# Patient Record
Sex: Female | Born: 1985
Health system: Southern US, Community
[De-identification: ages and names within clinical notes are randomized; demographics above are authoritative.]

## PROBLEM LIST (undated history)

## (undated) DIAGNOSIS — Z789 Other specified health status: Secondary | ICD-10-CM

## (undated) DIAGNOSIS — K219 Gastro-esophageal reflux disease without esophagitis: Secondary | ICD-10-CM

## (undated) HISTORY — PX: TUBAL LIGATION: SHX77

## (undated) HISTORY — PX: NO PAST SURGERIES: SHX2092

---

## 2009-02-12 ENCOUNTER — Ambulatory Visit: Payer: Self-pay | Admitting: Family Medicine

## 2009-02-12 DIAGNOSIS — M545 Low back pain: Secondary | ICD-10-CM

## 2009-03-04 ENCOUNTER — Ambulatory Visit: Payer: Self-pay | Admitting: Family Medicine

## 2009-03-04 DIAGNOSIS — K089 Disorder of teeth and supporting structures, unspecified: Secondary | ICD-10-CM | POA: Insufficient documentation

## 2009-07-24 ENCOUNTER — Ambulatory Visit: Payer: Self-pay | Admitting: Occupational Medicine

## 2009-07-24 DIAGNOSIS — S6390XA Sprain of unspecified part of unspecified wrist and hand, initial encounter: Secondary | ICD-10-CM | POA: Insufficient documentation

## 2010-10-07 ENCOUNTER — Ambulatory Visit: Payer: Self-pay | Admitting: Obstetrics and Gynecology

## 2010-10-07 ENCOUNTER — Other Ambulatory Visit
Admission: RE | Admit: 2010-10-07 | Discharge: 2010-10-07 | Payer: Self-pay | Source: Home / Self Care | Admitting: Obstetrics and Gynecology

## 2010-10-07 ENCOUNTER — Encounter: Payer: Self-pay | Admitting: Obstetrics and Gynecology

## 2010-10-07 LAB — CONVERTED CEMR LAB
Antibody Screen: NEGATIVE
Basophils Absolute: 0 10*3/uL (ref 0.0–0.1)
Basophils Relative: 0 % (ref 0–1)
Eosinophils Absolute: 0.1 10*3/uL (ref 0.0–0.7)
Eosinophils Relative: 1 % (ref 0–5)
HCT: 38 % (ref 36.0–46.0)
HIV: NONREACTIVE
Hemoglobin: 13.2 g/dL (ref 12.0–15.0)
Hepatitis B Surface Ag: NEGATIVE
Lymphocytes Relative: 19 % (ref 12–46)
Lymphs Abs: 2 10*3/uL (ref 0.7–4.0)
MCHC: 34.7 g/dL (ref 30.0–36.0)
MCV: 86.8 fL (ref 78.0–100.0)
Monocytes Absolute: 0.5 10*3/uL (ref 0.1–1.0)
Monocytes Relative: 5 % (ref 3–12)
Neutro Abs: 8.2 10*3/uL — ABNORMAL HIGH (ref 1.7–7.7)
Neutrophils Relative %: 76 % (ref 43–77)
Platelets: 272 10*3/uL (ref 150–400)
RBC: 4.38 M/uL (ref 3.87–5.11)
RDW: 12.6 % (ref 11.5–15.5)
Rh Type: POSITIVE
Rubella: 144.8 intl units/mL — ABNORMAL HIGH
WBC: 10.7 10*3/uL — ABNORMAL HIGH (ref 4.0–10.5)

## 2010-10-07 LAB — ABO/RH: RH Type: POSITIVE

## 2010-10-30 NOTE — L&D Delivery Note (Signed)
Delivery Note At  a viable female was delivered via  (Presentation: LOA ) at 28.  Mouth and nose bulb suctioned.   Cord clamped and cut infant placed on mother's abdomen.  Cord blood sampled. APGAR: 9,9 ; weight .  7lbs 9.4oz Placenta status: spont and intact with 3VC @1945   Intact perinuem. Good uterine firming with fundal massage and pitocin.   Anesthesia:  Epidural Episiotomy: none Lacerations: none Suture Repair: none Est. Blood Loss (mL): 400  Mom to postpartum.  Baby to nursery-stable.  Tayquan Gassman 05/11/2011, 7:55 PM

## 2010-11-04 ENCOUNTER — Ambulatory Visit
Admission: RE | Admit: 2010-11-04 | Discharge: 2010-11-04 | Payer: Self-pay | Source: Home / Self Care | Attending: Obstetrics and Gynecology | Admitting: Obstetrics and Gynecology

## 2010-11-17 ENCOUNTER — Other Ambulatory Visit (HOSPITAL_COMMUNITY): Payer: Self-pay | Admitting: *Deleted

## 2010-11-17 DIAGNOSIS — Z3689 Encounter for other specified antenatal screening: Secondary | ICD-10-CM

## 2010-12-03 ENCOUNTER — Other Ambulatory Visit: Payer: Self-pay | Admitting: Family Medicine

## 2010-12-03 ENCOUNTER — Other Ambulatory Visit (HOSPITAL_COMMUNITY): Payer: Self-pay | Admitting: *Deleted

## 2010-12-03 DIAGNOSIS — Z3689 Encounter for other specified antenatal screening: Secondary | ICD-10-CM

## 2010-12-05 ENCOUNTER — Encounter (HOSPITAL_COMMUNITY): Payer: Self-pay

## 2010-12-05 ENCOUNTER — Ambulatory Visit (HOSPITAL_COMMUNITY)
Admission: RE | Admit: 2010-12-05 | Discharge: 2010-12-05 | Disposition: A | Discharge status: Home / Self Care | Payer: Medicaid Other | Source: Ambulatory Visit | Attending: Family Medicine | Admitting: Family Medicine

## 2010-12-05 ENCOUNTER — Ambulatory Visit (HOSPITAL_COMMUNITY): Admission: RE | Admit: 2010-12-05 | Payer: Self-pay | Source: Home / Self Care | Admitting: Family Medicine

## 2010-12-05 DIAGNOSIS — Z3689 Encounter for other specified antenatal screening: Secondary | ICD-10-CM

## 2010-12-05 DIAGNOSIS — Z1389 Encounter for screening for other disorder: Secondary | ICD-10-CM | POA: Insufficient documentation

## 2010-12-05 DIAGNOSIS — O358XX0 Maternal care for other (suspected) fetal abnormality and damage, not applicable or unspecified: Secondary | ICD-10-CM | POA: Insufficient documentation

## 2010-12-05 DIAGNOSIS — Z363 Encounter for antenatal screening for malformations: Secondary | ICD-10-CM | POA: Insufficient documentation

## 2010-12-09 DIAGNOSIS — Z3480 Encounter for supervision of other normal pregnancy, unspecified trimester: Secondary | ICD-10-CM

## 2010-12-10 ENCOUNTER — Encounter: Payer: Self-pay | Admitting: Obstetrics & Gynecology

## 2010-12-28 ENCOUNTER — Encounter: Payer: Medicaid Other | Admitting: Obstetrics & Gynecology

## 2010-12-28 DIAGNOSIS — Z3480 Encounter for supervision of other normal pregnancy, unspecified trimester: Secondary | ICD-10-CM

## 2010-12-29 ENCOUNTER — Encounter: Payer: Self-pay | Admitting: Obstetrics & Gynecology

## 2011-01-17 ENCOUNTER — Encounter: Payer: Medicaid Other | Admitting: Obstetrics & Gynecology

## 2011-01-17 DIAGNOSIS — Z3400 Encounter for supervision of normal first pregnancy, unspecified trimester: Secondary | ICD-10-CM

## 2011-02-10 DIAGNOSIS — Z3480 Encounter for supervision of other normal pregnancy, unspecified trimester: Secondary | ICD-10-CM

## 2011-02-20 ENCOUNTER — Other Ambulatory Visit: Payer: Self-pay | Admitting: Family Medicine

## 2011-02-20 DIAGNOSIS — Z3689 Encounter for other specified antenatal screening: Secondary | ICD-10-CM

## 2011-02-24 ENCOUNTER — Encounter (INDEPENDENT_AMBULATORY_CARE_PROVIDER_SITE_OTHER): Payer: Medicaid Other

## 2011-02-24 DIAGNOSIS — Z3480 Encounter for supervision of other normal pregnancy, unspecified trimester: Secondary | ICD-10-CM

## 2011-03-10 ENCOUNTER — Encounter (INDEPENDENT_AMBULATORY_CARE_PROVIDER_SITE_OTHER): Payer: Medicaid Other

## 2011-03-10 DIAGNOSIS — Z3480 Encounter for supervision of other normal pregnancy, unspecified trimester: Secondary | ICD-10-CM

## 2011-03-24 ENCOUNTER — Encounter (INDEPENDENT_AMBULATORY_CARE_PROVIDER_SITE_OTHER): Payer: Medicaid Other

## 2011-03-24 DIAGNOSIS — Z3480 Encounter for supervision of other normal pregnancy, unspecified trimester: Secondary | ICD-10-CM

## 2011-04-07 ENCOUNTER — Encounter (INDEPENDENT_AMBULATORY_CARE_PROVIDER_SITE_OTHER): Payer: Medicaid Other

## 2011-04-07 DIAGNOSIS — Z3480 Encounter for supervision of other normal pregnancy, unspecified trimester: Secondary | ICD-10-CM

## 2011-04-10 ENCOUNTER — Encounter (INDEPENDENT_AMBULATORY_CARE_PROVIDER_SITE_OTHER): Payer: Medicaid Other

## 2011-04-10 DIAGNOSIS — Z3480 Encounter for supervision of other normal pregnancy, unspecified trimester: Secondary | ICD-10-CM

## 2011-04-10 DIAGNOSIS — O479 False labor, unspecified: Secondary | ICD-10-CM

## 2011-04-10 LAB — STREP B DNA PROBE: GBS: NEGATIVE

## 2011-04-17 ENCOUNTER — Encounter (INDEPENDENT_AMBULATORY_CARE_PROVIDER_SITE_OTHER): Payer: Medicaid Other

## 2011-04-17 DIAGNOSIS — Z3480 Encounter for supervision of other normal pregnancy, unspecified trimester: Secondary | ICD-10-CM

## 2011-04-24 ENCOUNTER — Encounter (INDEPENDENT_AMBULATORY_CARE_PROVIDER_SITE_OTHER): Payer: Medicaid Other

## 2011-04-24 DIAGNOSIS — Z3480 Encounter for supervision of other normal pregnancy, unspecified trimester: Secondary | ICD-10-CM

## 2011-05-02 ENCOUNTER — Encounter (INDEPENDENT_AMBULATORY_CARE_PROVIDER_SITE_OTHER): Payer: Medicaid Other | Admitting: Obstetrics & Gynecology

## 2011-05-02 DIAGNOSIS — Z3480 Encounter for supervision of other normal pregnancy, unspecified trimester: Secondary | ICD-10-CM

## 2011-05-08 ENCOUNTER — Ambulatory Visit (INDEPENDENT_AMBULATORY_CARE_PROVIDER_SITE_OTHER): Payer: Medicaid Other | Admitting: Physician Assistant

## 2011-05-08 VITALS — BP 110/60 | Wt 163.0 lb

## 2011-05-08 DIAGNOSIS — Z3490 Encounter for supervision of normal pregnancy, unspecified, unspecified trimester: Secondary | ICD-10-CM

## 2011-05-08 DIAGNOSIS — Z3480 Encounter for supervision of other normal pregnancy, unspecified trimester: Secondary | ICD-10-CM

## 2011-05-08 NOTE — Progress Notes (Signed)
Pulse  105 bilat lower quad pain

## 2011-05-10 ENCOUNTER — Inpatient Hospital Stay (HOSPITAL_COMMUNITY)
Admission: AD | Admit: 2011-05-10 | Discharge: 2011-05-13 | DRG: 775 | Disposition: A | Discharge status: Home / Self Care | Payer: Medicaid Other | Source: Ambulatory Visit | Attending: Obstetrics & Gynecology | Admitting: Obstetrics & Gynecology

## 2011-05-10 ENCOUNTER — Encounter (HOSPITAL_COMMUNITY): Payer: Self-pay | Admitting: *Deleted

## 2011-05-10 DIAGNOSIS — IMO0002 Reserved for concepts with insufficient information to code with codable children: Secondary | ICD-10-CM | POA: Diagnosis present

## 2011-05-10 DIAGNOSIS — Z3490 Encounter for supervision of normal pregnancy, unspecified, unspecified trimester: Secondary | ICD-10-CM

## 2011-05-10 HISTORY — DX: Other specified health status: Z78.9

## 2011-05-10 LAB — CBC
HCT: 36 % (ref 36.0–46.0)
Hemoglobin: 12.5 g/dL (ref 12.0–15.0)
MCH: 30.8 pg (ref 26.0–34.0)
MCV: 88.7 fL (ref 78.0–100.0)
RBC: 4.06 MIL/uL (ref 3.87–5.11)

## 2011-05-10 LAB — POCT FERN TEST: Fern Test: POSITIVE

## 2011-05-10 MED ORDER — LIDOCAINE HCL (PF) 1 % IJ SOLN
30.0000 mL | Freq: Once | INTRAMUSCULAR | Status: AC | PRN
  Filled 2011-05-10: qty 30

## 2011-05-10 MED ORDER — OXYTOCIN 20 UNITS IN LACTATED RINGERS INFUSION - SIMPLE
125.0000 mL/h | Freq: Once | INTRAVENOUS | Status: DC
  Filled 2011-05-10: qty 1000

## 2011-05-10 MED ORDER — PHENYLEPHRINE 40 MCG/ML (10ML) SYRINGE FOR IV PUSH (FOR BLOOD PRESSURE SUPPORT)
80.0000 ug | PREFILLED_SYRINGE | INTRAVENOUS | Status: DC | PRN
  Filled 2011-05-10 (×2): qty 5

## 2011-05-10 MED ORDER — ACETAMINOPHEN 325 MG PO TABS: 650.0000 mg | ORAL_TABLET | ORAL | Status: DC | PRN

## 2011-05-10 MED ORDER — FLEET ENEMA 7-19 GM/118ML RE ENEM: 1.0000 | ENEMA | RECTAL | Status: DC | PRN

## 2011-05-10 MED ORDER — ONDANSETRON HCL 4 MG/2ML IJ SOLN: 4.0000 mg | Freq: Four times a day (QID) | INTRAMUSCULAR | Status: DC | PRN

## 2011-05-10 MED ORDER — PHENYLEPHRINE 40 MCG/ML (10ML) SYRINGE FOR IV PUSH (FOR BLOOD PRESSURE SUPPORT)
80.0000 ug | PREFILLED_SYRINGE | INTRAVENOUS | Status: DC | PRN
  Filled 2011-05-10: qty 5

## 2011-05-10 MED ORDER — FENTANYL 2.5 MCG/ML BUPIVACAINE 1/10 % EPIDURAL INFUSION (WH - ANES)
2.0000 mL/h | INTRAMUSCULAR | Status: DC
  Administered 2011-05-11: 14 mL/h via EPIDURAL
  Filled 2011-05-10: qty 60

## 2011-05-10 MED ORDER — EPHEDRINE 5 MG/ML INJ
10.0000 mg | INTRAVENOUS | Status: DC | PRN
  Filled 2011-05-10 (×2): qty 4

## 2011-05-10 MED ORDER — LACTATED RINGERS IV SOLN
INTRAVENOUS | Status: DC
  Administered 2011-05-10 – 2011-05-11 (×4): via INTRAVENOUS

## 2011-05-10 MED ORDER — IBUPROFEN 600 MG PO TABS
600.0000 mg | ORAL_TABLET | Freq: Four times a day (QID) | ORAL | Status: DC | PRN
  Filled 2011-05-10: qty 1

## 2011-05-10 MED ORDER — DIPHENHYDRAMINE HCL 50 MG/ML IJ SOLN: 12.5000 mg | INTRAMUSCULAR | Status: DC | PRN

## 2011-05-10 MED ORDER — LACTATED RINGERS IV SOLN: 500.0000 mL | INTRAVENOUS | Status: DC | PRN

## 2011-05-10 MED ORDER — EPHEDRINE 5 MG/ML INJ
10.0000 mg | INTRAVENOUS | Status: DC | PRN
  Filled 2011-05-10: qty 4

## 2011-05-10 MED ORDER — CITRIC ACID-SODIUM CITRATE 334-500 MG/5ML PO SOLN: 30.0000 mL | ORAL | Status: DC | PRN

## 2011-05-10 MED ORDER — LACTATED RINGERS IV SOLN
500.0000 mL | Freq: Once | INTRAVENOUS | Status: AC
  Administered 2011-05-11: 500 mL via INTRAVENOUS

## 2011-05-10 NOTE — Plan of Care (Signed)
Problem: Consults Goal: Birthing Suites Patient Information Press F2 to bring up selections list Outcome: Completed/Met Date Met:  05/10/11  Pt > [redacted] weeks EGA

## 2011-05-10 NOTE — Progress Notes (Signed)
Pt states, " My water broke at 5:20 pm; a lot of water came out and it keeps come and my pad is soaked. Then contractions started right after that."

## 2011-05-11 ENCOUNTER — Inpatient Hospital Stay (HOSPITAL_COMMUNITY): Admission: RE | Admit: 2011-05-11 | Payer: Medicaid Other | Source: Ambulatory Visit

## 2011-05-11 ENCOUNTER — Encounter (HOSPITAL_COMMUNITY): Payer: Self-pay | Admitting: *Deleted

## 2011-05-11 ENCOUNTER — Encounter (HOSPITAL_COMMUNITY): Payer: Self-pay | Admitting: Anesthesiology

## 2011-05-11 ENCOUNTER — Inpatient Hospital Stay (HOSPITAL_COMMUNITY): Payer: Medicaid Other | Admitting: Anesthesiology

## 2011-05-11 DIAGNOSIS — IMO0002 Reserved for concepts with insufficient information to code with codable children: Secondary | ICD-10-CM | POA: Diagnosis present

## 2011-05-11 LAB — RPR: RPR Ser Ql: NONREACTIVE

## 2011-05-11 MED ORDER — PRENATAL PLUS 27-1 MG PO TABS
1.0000 | ORAL_TABLET | Freq: Every day | ORAL | Status: DC
  Administered 2011-05-12 – 2011-05-13 (×2): 1 via ORAL
  Filled 2011-05-11 (×2): qty 1

## 2011-05-11 MED ORDER — BENZOCAINE-MENTHOL 20-0.5 % EX AERO
1.0000 "application " | INHALATION_SPRAY | CUTANEOUS | Status: DC | PRN
  Administered 2011-05-12: 1 via TOPICAL

## 2011-05-11 MED ORDER — ZOLPIDEM TARTRATE 5 MG PO TABS: 5.0000 mg | ORAL_TABLET | Freq: Every evening | ORAL | Status: DC | PRN

## 2011-05-11 MED ORDER — DIPHENHYDRAMINE HCL 25 MG PO CAPS: 25.0000 mg | ORAL_CAPSULE | Freq: Four times a day (QID) | ORAL | Status: DC | PRN

## 2011-05-11 MED ORDER — TERBUTALINE SULFATE 1 MG/ML IJ SOLN: 0.2500 mg | Freq: Once | INTRAMUSCULAR | Status: DC | PRN

## 2011-05-11 MED ORDER — IBUPROFEN 600 MG PO TABS
600.0000 mg | ORAL_TABLET | Freq: Four times a day (QID) | ORAL | Status: DC
  Administered 2011-05-11 – 2011-05-13 (×7): 600 mg via ORAL
  Filled 2011-05-11 (×6): qty 1

## 2011-05-11 MED ORDER — SIMETHICONE 80 MG PO CHEW: 80.0000 mg | CHEWABLE_TABLET | ORAL | Status: DC | PRN

## 2011-05-11 MED ORDER — LIDOCAINE HCL 1.5 % IJ SOLN
INTRAMUSCULAR | Status: DC | PRN
  Administered 2011-05-11: 5 mL
  Administered 2011-05-11: 2 mL
  Administered 2011-05-11: 5 mL

## 2011-05-11 MED ORDER — SENNOSIDES-DOCUSATE SODIUM 8.6-50 MG PO TABS
1.0000 | ORAL_TABLET | Freq: Every day | ORAL | Status: DC
  Administered 2011-05-11: 2 via ORAL
  Administered 2011-05-12: 1 via ORAL

## 2011-05-11 MED ORDER — TERBUTALINE SULFATE 1 MG/ML IJ SOLN: 0.2500 mg | Freq: Once | INTRAMUSCULAR | Status: AC | PRN

## 2011-05-11 MED ORDER — TETANUS-DIPHTH-ACELL PERTUSSIS 5-2.5-18.5 LF-MCG/0.5 IM SUSP
0.5000 mL | Freq: Once | INTRAMUSCULAR | Status: DC
  Filled 2011-05-11: qty 0.5

## 2011-05-11 MED ORDER — LACTATED RINGERS IV SOLN
INTRAVENOUS | Status: DC
  Administered 2011-05-11: 500 mL via INTRAUTERINE

## 2011-05-11 MED ORDER — OXYTOCIN 20 UNITS IN LACTATED RINGERS INFUSION - SIMPLE
1.0000 m[IU]/min | INTRAVENOUS | Status: DC
  Administered 2011-05-11: 2 m[IU]/min via INTRAVENOUS

## 2011-05-11 MED ORDER — WITCH HAZEL-GLYCERIN EX PADS: MEDICATED_PAD | CUTANEOUS | Status: DC | PRN

## 2011-05-11 MED ORDER — LANOLIN HYDROUS EX OINT: TOPICAL_OINTMENT | CUTANEOUS | Status: DC | PRN

## 2011-05-11 MED ORDER — OXYCODONE-ACETAMINOPHEN 5-325 MG PO TABS: 1.0000 | ORAL_TABLET | ORAL | Status: DC | PRN

## 2011-05-11 NOTE — Progress Notes (Signed)
Spoke with K. Clelia Croft, CNM regarding POC for patient. Stated to let patient eat breakfast, shower and to call oncoming CNM for orders for Pitocin.

## 2011-05-11 NOTE — Progress Notes (Signed)
  Karen Khan is a 25 y.o. G3P2002 at [redacted]w[redacted]d admitted for active labor  Subjective:   Objective: BP 103/64  Pulse 93  Temp(Src) 98.5 F (36.9 C) (Axillary)  Resp 20  Ht 5' 3.5" (1.613 m)  Wt 161 lb 2 oz (73.086 kg)  BMI 28.09 kg/m2  LMP 07/28/2010   I/O this shift: In: 24 [I.V.:24] Out: -   FHT:  FHR: 145 bpm, variability: moderate,  accelerations:  Present,  decelerations:  Absent UC:   regular, every 1 minutes; tachysystole  SVE:   Dilation: 3.5 Effacement (%): 70 Station: -2 Exam by:: Dr. Orvan Khan Pitocin: 52mU/min --> 88mU/min  Labs: Lab Results  Component Value Date   WBC 22.7* 05/10/2011   HGB 12.5 05/10/2011   HCT 36.0 05/10/2011   MCV 88.7 05/10/2011   PLT 263 05/10/2011    Assessment / Plan: Augmentation of labor, progressing well, tachysystole  Labor: now with tachysystole with pit at 8, will decrease to 6 in hopes of having less frequent, stronger contractions Fetal Wellbeing:  Category I Pain Control:  Epidural I/D:  n/a Anticipated MOD:  NSVD  Karen Khan 05/11/2011, 4:36 PM

## 2011-05-11 NOTE — Progress Notes (Signed)
Pt id bands verified

## 2011-05-11 NOTE — Anesthesia Procedure Notes (Addendum)
Epidural Patient location during procedure: OB Start time: 05/11/2011 3:47 PM Reason for block: procedure for pain  Staffing Anesthesiologist: CASSIDY, AMY L. Performed by: other anesthesia staff   Preanesthetic Checklist Completed: patient identified, site marked, surgical consent, pre-op evaluation, timeout performed, IV checked, risks and benefits discussed and monitors and equipment checked  Epidural Patient position: sitting Prep: site prepped and draped and DuraPrep Patient monitoring: continuous pulse ox, blood pressure and heart rate Approach: midline Injection technique: LOR air  Needle:  Needle type: Tuohy  Needle gauge: 17 G Needle length: 9 cm Needle insertion depth: 5 cm cm Catheter type: closed end flexible Catheter size: 19 Gauge Catheter at skin depth: 10 cm Test dose: 1.5% lidocaine  Assessment Events: blood not aspirated, injection not painful, no injection resistance, negative IV test and no paresthesia  Additional Notes Procedure by Benay Pillow, SRNA.  Discussed risk of headache, infection, bleeding, nerve injury and failed or incomplete block.  Patient voices understanding and wishes to proceed.

## 2011-05-11 NOTE — H&P (Signed)
Karen Khan is a 25 y.o. female presenting for leaking fluid since 1700. Maternal Medical History:  Reason for admission: Reason for admission: rupture of membranes.  Contractions: Frequency: irregular.   Perceived severity is mild.    Fetal activity: Perceived fetal activity is normal.   Last perceived fetal movement was within the past hour.      OB History    Grav Para Term Preterm Abortions TAB SAB Ect Mult Living   3 2 2  0 0 0 0 0 0 2     Past Medical History  Diagnosis Date  . No pertinent past medical history    Past Surgical History  Procedure Date  . No past surgeries    Family History: family history is not on file. Social History:  reports that she has been smoking.  She does not have any smokeless tobacco history on file. She reports that she does not drink alcohol or use illicit drugs.  Review of Systems  All other systems reviewed and are negative.    Dilation: 2.5 Effacement (%): 50 Station: -2 Exam by:: r. gagnon,rnc Blood pressure 113/76, pulse 103, temperature 98.7 F (37.1 C), temperature source Oral, resp. rate 20, height 5' 3.5" (1.613 m), weight 73.086 kg (161 lb 2 oz), last menstrual period 07/28/2010. Maternal Exam:  Uterine Assessment: Contraction strength is mild.  Contraction frequency is irregular.   Abdomen: Patient reports no abdominal tenderness.   Physical Exam  Prenatal labs: ABO, Rh:   Antibody: NEG (12/09 1918) Rubella:   RPR: NON REAC (12/09 1918)  HBsAg: NEGATIVE (12/09 1918)  HIV: NON REACTIVE (12/09 1918)  GBS:     Assessment/Plan: Admit to L&D. Expectant management. Will augment ctx prn.   Karen Khan B 05/11/2011, 12:23 AM

## 2011-05-11 NOTE — Progress Notes (Signed)
Karen Khan is a 25 y.o. G3P2002 at [redacted]w[redacted]d admitted for rupture of membranes  Subjective: Pt states she is doing well without any acute complaints.  Objective: BP 107/57  Pulse 89  Temp(Src) 98.6 F (37 C) (Oral)  Resp 20  Ht 5' 3.5" (1.613 m)  Wt 73.086 kg (161 lb 2 oz)  BMI 28.09 kg/m2  LMP 07/28/2010      FHT:  FHR: 150 bpm, variability: moderate,  accelerations:  Abscent,  decelerations:  Present early with occ var.+scalp stimulation UC:   regular, every 2 minutes-MVUs >200 x2hrs SVE:   3-4/70/-2 Pitocin: at 8/min  Labs: Lab Results  Component Value Date   WBC 22.7* 05/10/2011   HGB 12.5 05/10/2011   HCT 36.0 05/10/2011   MCV 88.7 05/10/2011   PLT 263 05/10/2011    Assessment / Plan: Augmentation of labor, progressing well Starting amnioinfusion   Labor: progressing with pitocin, continue current rate.  Fetal Wellbeing:  Category II Pain Control:  Labor support without medications I/D:  n/a Anticipated MOD:  NSVD  Sheily Lineman 05/11/2011, 1:23 PM

## 2011-05-11 NOTE — Progress Notes (Signed)
  Karen Khan is a 25 y.o. G3P2002 at [redacted]w[redacted]d admitted for rupture of membranes @ 1700 on 05/10/11  Subjective:   Objective: BP 110/60  Pulse 91  Temp(Src) 98.6 F (37 C) (Oral)  Resp 20  Ht 5' 3.5" (1.613 m)  Wt 161 lb 2 oz (73.086 kg)  BMI 28.09 kg/m2  LMP 07/28/2010      FHT:  FHR: 150-155 bpm, variability: moderate,  accelerations:  Abscent,  decelerations:  Present : multiple earlies, occ variables UC:   regular, every 1-3 minutes SVE:   Dilation: 3 Effacement (%): 60 Station: -2 Exam by:: Farrell Pantaleo, MD  Labs: Lab Results  Component Value Date   WBC 22.7* 05/10/2011   HGB 12.5 05/10/2011   HCT 36.0 05/10/2011   MCV 88.7 05/10/2011   PLT 263 05/10/2011    Assessment / Plan: Augmentation of labor, progressing well  Labor: Cervix remains unchanged; IUPC inserted, will increase pitocin to obtain adequate contractions then recheck Preeclampsia:  N/A Fetal Wellbeing:  Category I Pain Control:  Labor support without medications I/D:  n/a Anticipated MOD:  NSVD  Karen Khan 05/11/2011, 12:12 PM

## 2011-05-11 NOTE — Progress Notes (Signed)
Karen Khan is a 25 y.o. G3P2002 at [redacted]w[redacted]d admitted for rupture of membranes  Subjective: Pt desires epidural due to increased pain with contractions.   Objective: BP 114/63  Pulse 103  Temp(Src) 98.3 F (36.8 C) (Axillary)  Resp 20  Ht 5' 3.5" (1.613 m)  Wt 73.086 kg (161 lb 2 oz)  BMI 28.09 kg/m2  LMP 07/28/2010   I/O this shift: In: 24 [I.V.:24] Out: -   FHT:  FHR: 150 bpm, variability: moderate,  accelerations:  Present,  decelerations:  Absent UC:   regular, every 1-2 minutes SVE:   Dilation: 3.5 Effacement (%): 70 Station: -2 Exam by:: Dr. Fara Boros Pitocin at 8/min.   Labs: Lab Results  Component Value Date   WBC 22.7* 05/10/2011   HGB 12.5 05/10/2011   HCT 36.0 05/10/2011   MCV 88.7 05/10/2011   PLT 263 05/10/2011    Assessment / Plan: Augmentation of labor, progressing well  Labor: will hold amnioinfusion due to lack of fluid return.  fetal var. improved. will await epidural and reassess Fetal Wellbeing:  Category I Pain Control:  Labor support without medications I/D:  n/a Anticipated MOD:  NSVD  Zackari Ruane 05/11/2011, 3:02 PM

## 2011-05-11 NOTE — Anesthesia Preprocedure Evaluation (Signed)
Anesthesia Evaluation  Name, MR# and DOB Patient awake  General Assessment Comment  Reviewed: Allergy & Precautions, H&P  and Patient's Chart, lab work & pertinent test results  History of Anesthesia Complications Negative for: history of anesthetic complications  Airway Mallampati: II TM Distance: <3 FB Neck ROM: full    Dental   Pulmonaryneg pulmonary ROS    clear to auscultation  pulmonary exam normal   Cardiovascular regular Normal   Neuro/PsychNegative Neurological ROS Negative Psych ROS  GI/Hepatic/Renal negative GI ROS, negative Liver ROS, and negative Renal ROS (+)       Endo/Other  Negative Endocrine ROS (+)   Abdominal   Musculoskeletal  Hematology negative hematology ROS (+)   Peds  Reproductive/Obstetrics (+) Pregnancy   Anesthesia Other Findings             Anesthesia Physical Anesthesia Plan  ASA: II  Anesthesia Plan: Epidural   Post-op Pain Management:    Induction:   Airway Management Planned:   Additional Equipment:   Intra-op Plan:   Post-operative Plan:   Informed Consent: I have reviewed the patients History and Physical, chart, labs and discussed the procedure including the risks, benefits and alternatives for the proposed anesthesia with the patient or authorized representative who has indicated his/her understanding and acceptance.     Plan Discussed with:   Anesthesia Plan Comments:         Anesthesia Quick Evaluation

## 2011-05-11 NOTE — Progress Notes (Signed)
  Karen Khan is a 25 y.o. G3P2002 at [redacted]w[redacted]d   Subjective: Doing well, starting to feel some contractions every few minutes, but not very strong or painful.  Objective: BP 116/64  Pulse 90  Temp(Src) 98.4 F (36.9 C) (Oral)  Resp 20  Ht 5' 3.5" (1.613 m)  Wt 161 lb 2 oz (73.086 kg)  BMI 28.09 kg/m2  LMP 07/28/2010      FHT:  FHR: 145 bpm, variability: moderate,  accelerations:  Present,  decelerations:  Absent UC:   irregular, difficult to pick up on monitor SVE:   Dilation: 2.5 Effacement (%): 50 Station: -2 Exam by:: r. gagnon,rnc  Labs: Lab Results  Component Value Date   WBC 22.7* 05/10/2011   HGB 12.5 05/10/2011   HCT 36.0 05/10/2011   MCV 88.7 05/10/2011   PLT 263 05/10/2011    Assessment / Plan: Spontaneous labor, progressing normally, start pit, consider IUPC if still no cervical change after 2 hours of pit to assess adequacy of contractions.  Labor: Progressing normally, will start pitocin as pt had SROM 5pm 7/11 and has not made much cervical change Preeclampsia:  n/a Fetal Wellbeing:  Category I Pain Control:  Labor support without medications I/D:  n/a Anticipated MOD:  NSVD  Karen Khan 05/11/2011, 9:07 AM

## 2011-05-11 NOTE — Progress Notes (Signed)
  Fariha Goto is a 25 y.o. G3P2002 at [redacted]w[redacted]d admitted for rupture of membranes  Subjective: Doing well, epidural in place, feeling a little pressure, about the same as before when she was 5cm.   Objective: BP 111/70  Pulse 91  Temp(Src) 98.2 F (36.8 C) (Axillary)  Resp 20  Ht 5' 3.5" (1.613 m)  Wt 161 lb 2 oz (73.086 kg)  BMI 28.09 kg/m2  LMP 07/28/2010   I/O this shift: In: 24 [I.V.:24] Out: 450 [Urine:450]  FHT:  FHR: 145 bpm, variability: moderate,  accelerations:  Present,  decelerations:  Present : variables UC:   regular, every 2 minutes SVE:   Dilation: 5 Effacement (%): 90 Station: -3 Exam by:: GPayne, RN Pitocin at 58mU/min  Labs: Lab Results  Component Value Date   WBC 22.7* 05/10/2011   HGB 12.5 05/10/2011   HCT 36.0 05/10/2011   MCV 88.7 05/10/2011   PLT 263 05/10/2011    Assessment / Plan: Augmentation of labor, progressing well  Labor: Progressing normally and will continue pitocin, ctx adequate at this time w/ MVUs >200 Fetal Wellbeing:  Category II Pain Control:  Epidural I/D:  n/a Anticipated MOD:  NSVD  Migel Hannis 05/11/2011, 6:57 PM

## 2011-05-12 ENCOUNTER — Encounter (HOSPITAL_COMMUNITY): Payer: Self-pay | Admitting: *Deleted

## 2011-05-12 MED ORDER — BENZOCAINE-MENTHOL 20-0.5 % EX AERO
INHALATION_SPRAY | CUTANEOUS | Status: AC
  Filled 2011-05-12: qty 56

## 2011-05-12 NOTE — Progress Notes (Signed)
Mom a P3, but this is first baby being breastfed.  Mom says nursing going well.  When first walked into room, baby didn't have a good latch (LS=5), but after showing Mom how to reposition, frequent swallows heard w/rhythmic suckling.  Mom also able to hear the swallows.  Mom enc to call with any concerns.  Olivia Mackie, Nidia Grogan [place].

## 2011-05-12 NOTE — Progress Notes (Signed)
Post Partum Day 1  Subjective: no complaints, up ad lib, voiding, tolerating PO, + flatus and breast feeding and desires IUD for [facility].   Objective: Blood pressure 97/58, pulse 68, temperature 98.1 F (36.7 C), temperature source Oral, resp. rate 16, height 5' 3.5" (1.613 m), weight 73.086 kg (161 lb 2 oz), SpO2 98.00%, unknown if currently breastfeeding.  Physical Exam:  General: alert and cooperative Lochia: appropriate Chest: CTAB Heart: RRR no m/r/g Abdomen: +BS, soft, nontender Uterine Fundus: firm at umbilicus -1 DVT Evaluation: No evidence of DVT seen on physical exam.   Basename 05/10/11 2010  HGB 12.5  HCT 36.0    Assessment/Plan: Plan for discharge tomorrow Cont routine postpartum care   LOS: 2 days   Tyson Masin 05/12/2011, 7:41 AM

## 2011-05-12 NOTE — Progress Notes (Signed)
UR chart review completed.  

## 2011-05-13 MED ORDER — IBUPROFEN 600 MG PO TABS: 600.0000 mg | ORAL_TABLET | Freq: Four times a day (QID) | ORAL | Status: AC

## 2011-05-13 MED ORDER — LANOLIN HYDROUS EX OINT: 1.0000 "application " | TOPICAL_OINTMENT | CUTANEOUS | Status: DC | PRN

## 2011-05-13 MED ORDER — DOCUSATE SODIUM 100 MG PO CAPS: 100.0000 mg | ORAL_CAPSULE | Freq: Two times a day (BID) | ORAL | Status: DC

## 2011-05-13 NOTE — Discharge Summary (Addendum)
  Obstetric Discharge Summary Reason for Admission: rupture of membranes Prenatal Procedures: ultrasound Intrapartum Procedures:augmented with Pitocin Postpartum Procedures: none Complications-Operative and Postpartum: none  Hemoglobin  Date Value Range Status  05/10/2011 12.5  12.0-15.0 (g/dL) Final     HCT  Date Value Range Status  05/10/2011 36.0  36.0-46.0 (%) Final    Discharge Diagnoses: Term Pregnancy-delivered  Discharge Information: Date: 05/13/2011 Activity: pelvic rest Diet: routine Medications: Ibuprophen and Colace Condition: stable Instructions: refer to practice specific booklet Discharge to: home Follow-up Information    Follow up with [facility]. Make an appointment in 6 weeks.   Contact information:   [address] [place] [address]          Newborn Data: Live born  Information for the patient's newborn:  Merrilee, Ancona [[id number]]  female ; APGAR 8 at and 9 at , ; weight: 7lb9.5oz ;  Home with mother.  Karen Khan 05/13/2011, 7:29 AM

## 2011-05-13 NOTE — Progress Notes (Signed)
  Post Partum Day 2 Subjective: no complaints, up ad lib, voiding, tolerating PO, + flatus and breastfeeding/formula feeding. Plans on IUD for contraception.   Objective: Blood pressure 107/70, pulse 85, temperature 97.9 F (36.6 C), temperature source Oral, resp. rate 18, height 5' 3.5" (1.613 m), weight 161 lb 2 oz (73.086 kg), last menstrual period 07/28/2010, SpO2 98.00%, unknown if currently breastfeeding.  Physical Exam:  General: alert and cooperative Lochia: appropriate Uterine Fundus: firm, 1cm below umbilicus DVT Evaluation: No evidence of DVT seen on physical exam. Negative Homan's sign.   Basename 05/10/11 2010  HGB 12.5  HCT 36.0    Assessment/Plan: 1. Discharge home today 2. Contraception: IUD 3. Follow up in [facility] in 6 weeks.    LOS: 3 days   Chelsia Serres 05/13/2011, 7:14 AM

## 2011-06-02 ENCOUNTER — Telehealth: Payer: Self-pay | Admitting: *Deleted

## 2011-06-02 DIAGNOSIS — B3789 Other sites of candidiasis: Secondary | ICD-10-CM

## 2011-06-02 MED ORDER — FLUCONAZOLE 150 MG PO TABS: 150.0000 mg | ORAL_TABLET | Freq: Once | ORAL | Status: AC

## 2011-06-02 NOTE — Telephone Encounter (Signed)
Pt called with c/o yeast around her breast  As she is breast feeding.  Areas are itching and red and baby is not nursing as well as he did.  Pt notices that baby had white patches on his tongue.  Spoke with Karen Khan,CNM and RX for Difulcan 150mg  #2 called to [facility] [address]  Pt to use now and repeat in 3 days prn.  Pt is calling her pediatrician to have baby seen as well.

## 2011-06-14 ENCOUNTER — Encounter: Payer: Self-pay | Admitting: *Deleted

## 2011-06-20 ENCOUNTER — Telehealth: Payer: Self-pay

## 2011-06-21 ENCOUNTER — Telehealth: Payer: Self-pay | Admitting: *Deleted

## 2011-06-21 DIAGNOSIS — O9102 Infection of nipple associated with the puerperium: Secondary | ICD-10-CM

## 2011-06-21 MED ORDER — FLUCONAZOLE 150 MG PO TABS: 150.0000 mg | ORAL_TABLET | Freq: Once | ORAL | Status: AC

## 2011-06-21 NOTE — Telephone Encounter (Signed)
Pt called with c/o's of yeast around left nipple.  She is breast feeding and at this time baby is not showing any signs of yeast in his mouth.  Pt requesting Diflucan be called into [facility] [address] Spoke with Dr. Penne Lash and RX for Diflucan 150mg  called in times one.

## 2011-06-23 ENCOUNTER — Ambulatory Visit (INDEPENDENT_AMBULATORY_CARE_PROVIDER_SITE_OTHER): Payer: Medicaid Other | Admitting: Obstetrics and Gynecology

## 2011-06-23 ENCOUNTER — Encounter: Payer: Self-pay | Admitting: Obstetrics and Gynecology

## 2011-06-23 VITALS — BP 106/69 | HR 73 | Wt 149.0 lb

## 2011-06-23 DIAGNOSIS — O26839 Pregnancy related renal disease, unspecified trimester: Secondary | ICD-10-CM

## 2011-06-23 LAB — POCT URINALYSIS DIPSTICK
Bilirubin, UA: NEGATIVE
Ketones, UA: NEGATIVE
Leukocytes, UA: NEGATIVE
Protein, UA: NEGATIVE

## 2011-06-23 NOTE — Patient Instructions (Signed)
Reviewed exercise regimen, diet and increased fluid intake while breastfeeding. No sexfor 2 wk until IUD insertion visit

## 2011-06-23 NOTE — Progress Notes (Signed)
Reviewed signs and symptoms PP depression and she denies all. Husband is helpful with baby, siblings adjusting well and about to retrun to school. She has no plans to return to work.

## 2011-06-23 NOTE — Progress Notes (Signed)
Addended by: Nelly Laurence H on: 06/23/2011 01:07 PM   Modules accepted: Orders

## 2011-06-23 NOTE — Progress Notes (Signed)
  Subjective:    Patient ID: Phil Dopp, female    DOB: 1986/10/27, 25 y.o.   MRN: [id number]  HPI [redacted]W[redacted]d post uncomplicated NSVD, no lacerations. Pleased with delivery experience, no questions.  "Jean Rosenthal" doing well.  Breastfeeding exclusively without problems. Lochia stopped a week ago. Has resumed intercourse without problems. Sleeping OK. Exercising (Wii game) every other day. Undecided about when t return to work. Desires Mirena but wants to schedule it on a Wed. so husband can babysit. Has product information.     Review of Systems as above     Objective:   Physical Exam   Gen: WNWD, NAD Neck :thyroid not enlarged Lungs: CTA Cor: RRR w/o m Breasts: lactational, nipples protract, no inflamation Ext: no edema          Assessment & Plan:  Normal 6wk PP exam. Will return for Mirena in next 1-2 wks. Advised to abstain from intercourse for that time.  CCUA sent to f/u proteinuria present during pregnancy.  Pap due December.

## 2011-06-28 NOTE — Telephone Encounter (Signed)
Message sent to [facility].

## 2011-07-05 ENCOUNTER — Ambulatory Visit (INDEPENDENT_AMBULATORY_CARE_PROVIDER_SITE_OTHER): Payer: Medicaid Other | Admitting: Obstetrics & Gynecology

## 2011-07-05 VITALS — BP 117/78 | HR 94 | Wt 152.0 lb

## 2011-07-05 DIAGNOSIS — Z30430 Encounter for insertion of intrauterine contraceptive device: Secondary | ICD-10-CM

## 2011-07-05 DIAGNOSIS — B372 Candidiasis of skin and nail: Secondary | ICD-10-CM

## 2011-07-05 LAB — POCT URINE PREGNANCY: Preg Test, Ur: NEGATIVE

## 2011-07-05 MED ORDER — LEVONORGESTREL 20 MCG/24HR IU IUD
INTRAUTERINE_SYSTEM | Freq: Once | INTRAUTERINE | Status: AC
  Administered 2011-07-05: 12:00:00 via INTRAUTERINE

## 2011-07-05 MED ORDER — FLUCONAZOLE 100 MG PO TABS: 100.0000 mg | ORAL_TABLET | Freq: Every day | ORAL | Status: AC

## 2011-07-05 NOTE — Progress Notes (Signed)
  Subjective:    Patient ID: Karen Khan, female    DOB: 1986-10-14, 25 y.o.   MRN: [id number]  HPI Pt complaining of return of nipple / ductal yeast infection in left breast.  Has stopped breast feeding due to pain.  Also wants Mirena IUD.   Review of Systems  Constitutional: Negative.   Respiratory: Negative.   Cardiovascular: Negative.   Gastrointestinal: Negative.   Genitourinary:       Pain in left nipple       Objective:   Physical Exam  Constitutional: She appears well-developed and well-nourished. No distress.  HENT:  Head: Normocephalic and atraumatic.  Eyes: Conjunctivae are normal.  Abdominal: Soft. She exhibits no distension. There is no tenderness.  Genitourinary: Vagina normal and uterus normal.       anteverted uterus. Yeast on vulva.  Left nipple deeper pink/purple c/w ductal yeast.          Assessment & Plan:  Patient identified, informed consent performed, signed copy in chart, time out was performed.  Urine pregnancy test negative.  Speculum placed in the vagina.  Cervix visualized.  Cleaned with Betadine x 2.  Grasped anteriourly with a single tooth tenaculum.  Uterus sounded to 6 cm.  Mirena IUD placed per manufacturer's recommendations.  Strings trimmed to 2-3 cm.   Patient given post procedure instructions and Mirena care card with expiration date.  Patient is asked to check IUD strings periodically and follow up in 4-6 weeks for IUD check.   Rx with diflucan for 1 week for ductal yeast.  Breast feeding encouraged.

## 2011-07-05 NOTE — Progress Notes (Signed)
Addended by: Nelly Laurence H on: 07/05/2011 12:29 PM   Modules accepted: Orders

## 2011-07-05 NOTE — Patient Instructions (Signed)
IMPORTANT: HOW TO USE THIS INFORMATION:  This is a summary and does NOT have all possible information about this product. This information does not assure that this product is safe, effective, or appropriate for you. This information is not individual medical advice and does not substitute for the advice of your health care professional. Always ask your health care professional for complete information about this product and your specific health needs.    LEVONORGESTREL-RELEASING IMPLANT - INTRAUTERINE (lee-voh-nor-JEST-rell)    COMMON BRAND NAME(S): Mirena    USES:  This product is a small, flexible device that is placed in the womb (uterus) to prevent pregnancy. It is used in women who desire reversible birth control that works for a long time (up to 5 years). The device works by slowly releasing a hormone (levonorgestrel) that is similar to a certain substance made by a woman's body. This product is only intended for women who have previously given birth and have only one sexual partner. It is not meant for women with a history of certain infections/conditions (e.g., pelvic inflammatory disease, sexually transmitted disease, a certain problem pregnancy called ectopic pregnancy). For more information, consult your doctor. The use of this medication device does not protect you or your partner against sexually transmitted diseases (e.g., HIV, gonorrhea). Carefully read all of the information provided by your doctor, and ask any questions you may have about this product or other birth control methods that may be right for you.    HOW TO USE:  Read the Patient Information Leaflet provided by your pharmacist before this medication device is inserted and each time it is re-inserted. The leaflet contains very important information about side effects and when it is important to call your doctor. If you have any questions, consult your doctor or pharmacist. This product is inserted into your uterus by a properly  trained health care professional, usually once every 5 years or as determined by your doctor. The medication in the device is slowly released into the body over a 5-year period. Have a follow-up appointment 4-12 weeks after insertion of this product to check that it is still correctly in place. If you still desire birth control after 5 years, the medication device may be replaced with a new one. The medication device may also be removed at any time by a properly trained health care professional. Learn all the instructions on how and when to check this product and its proper positioning in your body, and make sure you understand the problems that may occur with this product. See also Precautions section.    SIDE EFFECTS:  Irregular vaginal bleeding (e.g., spotting), cramps, headache, nausea, breast pain, acne, rash, hair loss, weight gain, or decreased interest in sex may occur. If any of these effects persist or worsen, tell your doctor promptly. Remember that your doctor has prescribed this medication device because he or she has judged that the benefit to you is greater than the risk of side effects. Many people using this medication device do not have serious side effects. Tell your doctor immediately if any of these serious side effects occur: lack of menstrual period, unexplained fever, chills, trouble breathing, mental/mood changes (e.g., depression, nervousness), vaginal swelling/itching, painful intercourse. Tell your doctor immediately if any of these unlikely but serious side effects occur: migraine/severe headache, vomiting, tiredness, fast/pounding heartbeat. Tell your doctor immediately if any of these highly unlikely but very serious side effects occur: prolonged or heavy vaginal bleeding, unusual vaginal discharge/odor, vaginal sores, abdominal/pelvic pain or   tenderness, lumps in the breast, yellowing eyes/skin, dark urine, persistent nausea, trouble urinating. A very serious allergic reaction to  this drug is rare. However, seek immediate medical attention if you notice any of the following symptoms of a serious allergic reaction: rash, itching/swelling (especially of the face/tongue/throat), severe dizziness, trouble breathing. This is not a complete list of possible side effects. If you notice other effects not listed above, contact your doctor or pharmacist. In the [place] - Call your doctor for medical advice about side effects. You may report side effects to FDA at 1-800-FDA-1088. In [place] - Call your doctor for medical advice about side effects. You may report side effects to [employer] [place] at 1-866-234-2345.    PRECAUTIONS:  Before using this medication device, tell your doctor or pharmacist if you are allergic to levonorgestrel, or to any other progestins (e.g., norethindrone, desogestrel); or if you have any other allergies. This product may contain inactive ingredients, which can cause allergic reactions or other problems. Talk to your pharmacist for more details. This medication device should not be used if you have certain medical conditions. Before using this product, consult your doctor or pharmacist if you have: current known or suspected pregnancy, previous ectopic pregnancy, uterus problems (e.g., cancer, endometriosis, fibroids, pelvic inflammatory disease-PID), other IUD (intrauterine device) still in place, vaginal problems (e.g., infection), breast cancer, liver disease/tumors, any condition that affects your immune system (e.g., AIDS, leukemia). Before using this product, tell your doctor your medical history, especially of: bleeding problems (e.g., menstrual changes, clotting problems), heart problems (e.g., congenital valve conditions), high blood pressure, migraine headaches, stroke, diabetes. If you have diabetes, this medication may make it harder to control your blood sugar levels. Monitor your blood sugar regularly as directed by your doctor. Tell your doctor the results and any  symptoms such as increased thirst/urination. Your anti-diabetic medication or diet may need to be adjusted. This medication device may sometimes come out by itself or move out of place. This may result in unwanted pregnancy or other problems. After each menstrual period, check to make sure it is in the right place. Talk to your doctor about how to check your device. If it comes out or you cannot feel its threads, call your doctor promptly, and use a backup birth control method such as condoms. If you or partner has any other sexual partners, this medication device may no longer be a good choice for pregnancy prevention. If you or your partner becomes HIV positive, or if you think you may have been exposed to any sexually transmitted disease, contact your doctor immediately. You should consider having this device removed. This medication device must not be used during pregnancy. If you become pregnant or think you may be pregnant, tell your doctor immediately. If you have just given birth and are not breast-feeding, or if you have had a pregnancy loss or abortion after the 3 months of pregnancy, wait at least 6 weeks (or as directed by your doctor) before using this medication device. Consult your doctor about the problems that may occur during pregnancy while using this product. Levonorgestrel passes into breast milk. Consult your doctor before breast-feeding.    DRUG INTERACTIONS:  Your doctor or pharmacist may already be aware of any possible drug interactions and may be monitoring you for them. Do not start, stop, or change the dosage of any medicine before checking with them first. Before using this medication device, tell your doctor of all prescription and nonprescription medications you may use, especially   of: "blood thinners" (e.g., warfarin), birth control taken by mouth or applied to the skin (patch), certain drug used for varicose vein treatment (sodium tetradecyl sulfate), drugs that affect your immune  response (e.g., corticosteroids such as prednisone). This document does not contain all possible interactions. Therefore, before using this product, tell your doctor or pharmacist of all the products you use. Keep a list of all your medications with you, and share the list with your doctor and pharmacist.    OVERDOSE:  Overdose with this medication is very unlikely because of the way the drug is released from this device. Consult your doctor or pharmacist for more information.    NOTES:  Do not share this medication with others. Keep all appointments with your doctor and the laboratory. You should have regular complete physical exams including blood pressure, breast exam, pelvic exam, and screening for cervical cancer (Pap smear). Follow your doctor's instructions for examining your own breasts, and report any lumps immediately. Consult your doctor for more details.    MISSED DOSE:  Not applicable.    STORAGE:  Before use, store at room temperature at 77 degrees F (25 degrees C) away from light and moisture. Brief storage between 59-86 degrees F (15-30 degrees C) is permitted. Keep all medications and medical devices away from children and pets. Do not flush medications down the toilet or pour them into a drain unless instructed to do so. Properly discard this product when it is expired or no longer needed. Consult your pharmacist or local waste disposal company for more details about how to safely discard your product.    Information last revised June 2010 Copyright(c) 2010 [employer]      

## 2011-08-16 ENCOUNTER — Ambulatory Visit: Payer: Medicaid Other | Admitting: Obstetrics & Gynecology

## 2011-08-18 ENCOUNTER — Ambulatory Visit: Payer: Medicaid Other | Admitting: Advanced Practice Midwife

## 2011-08-28 ENCOUNTER — Ambulatory Visit (INDEPENDENT_AMBULATORY_CARE_PROVIDER_SITE_OTHER): Payer: Self-pay | Admitting: Advanced Practice Midwife

## 2011-08-28 VITALS — BP 105/66 | HR 89 | Temp 97.6°F | Resp 16 | Ht 65.0 in | Wt 159.0 lb

## 2011-08-28 DIAGNOSIS — Z30431 Encounter for routine checking of intrauterine contraceptive device: Secondary | ICD-10-CM

## 2011-08-28 NOTE — Patient Instructions (Signed)
Contraceptive Devices (IUD) IUD stands for intrauterine device. Intrauterine means inside the womb (uterus). The purpose of the IUD is to prevent pregnancy. IUDs make it more difficult for your partner's sperm to get into your womb and into your fallopian tubes, where the eggs are fertilized. IUDs also alter the secretions of your cervix, which make it a stronger sperm barrier. They also affect the lining of the womb, so it is harder for an egg to implant. The IUD does not cause an abortion. There are 2 types of IUDs available:  Copper IUD gives off a small amount of copper inside the uterus. This prevents the sperm from going through the uterus, up into the fallopian tube, where the egg is fertilized. The copper IUD can also damage or prevent the fertilized egg from attaching on the inside lining of the uterus. It can stay in place for 10 years. The copper IUD can be used as an emergency contraceptive, if inserted within 5 days after having unprotected sexual intercourse.   Hormone IUD contains progestin (synthetic progesterone), and it releases this hormone into the uterus. The hormone thickens the mucus on the cervix and prevents sperm from entering the uterus. It also weakens the sperm that get into the uterus, so that the sperm and egg cannot live in the fallopian tube. It also makes the inside lining of the uterus thinner, which makes it difficult for a fertilized egg to attach to the uterus. The hormone progestin in the IUD decreases the amount of bleeding during a menstrual period and can be helpful in women who have heavy menstrual periods. The hormone IUD can stay in place for 5 years.  SIDE EFFECTS OF THE IUD:  There may be more cramping or pain with periods.   It may cause heavier, longer periods, which can cause lack of red blood cells (anemia) and can interfere with your daily and sexual activities.  This method of birth control is not usually the best choice for a woman with heavy or  prolonged periods. The birth control pill may be a better choice. IUDs work best for women who have already had a pregnancy, because the cervix is more open, making the insertion of the device easier and less painful. However, many women without children use the IUD. One of the main goals of patient selection is to prevent unintentionally inserting an IUD into a patient who has an STD (sexually transmitted disease), who is at high risk of exposure to an STD, or who is already pregnant. That is why the IUD is inserted during, or right after, a menstrual period. REASONS NOT TO USE AN IUD:  The womb or cervix is not shaped normally.   You have or have had a pelvic infection, such as an STD, in the past 3 months.   You have or suspect cancer in the female organs.   You have an abnormal Pap smear.   You have certain liver diseases.   There is severe infection or inflammation of the cervix (cervicitis).   You have unexplained vaginal bleeding.   You have heart valve problems (unless a heart specialist advises otherwise).   You are allergic to copper (rare).   You previously had a pregnancy outside the uterus (ectopic).   You are pregnant or suspect you are pregnant.   You have prolonged or heavy periods, or heavy pain or cramping with periods (except for the hormone IUD).   You have or suspect pelvic cancer.   You have an   STD.   The cervix or uterus has problems (cervical stenosis, fibroids in the uterus) making it difficult to insert the IUD.  IUDs should be removed when a woman becomes menopausal or pregnant. BENEFITS OF THE IUD:  You are always ready to have sexual intercourse.   The copper IUD does not interfere with your female hormones.   The copper IUD can be used as emergency contraception.   An IUD can be used while nursing.   It works immediately after insertion, and there is no problem getting pregnant when it is removed.   It does not interfere with foreplay.    The progesterone IUD can make heavy menstrual periods lighter.   The progesterone IUD can be used for 5 years.   The copper IUD can be used for 10 years.  RISKS AND COMPLICATIONS  Putting the IUD through the uterus, into the pelvis or abdomen (perforation of the uterus).   Losing the IUD (expulsion). This is more common in women who never had children.   When pregnant with an IUD, there is an increased chance of an infection and loss of the pregnancy.   Pregnancy in the fallopian tube (ectopic).   STD, in women who have more than 1 sex partner. The IUD does not protect against STDs.   Other minor side effects may include:   Headaches.   Feeling sick to your stomach (nausea).   Breast tenderness.   Depression.  PROCEDURE   The IUD is inserted during or right after a menstrual period, to make sure you are not pregnant.   You will lie on an exam table, naked from the waist down.   Your caregiver will do an exam to determine the size and position of your uterus.   Usually, an anesthetic is not needed.   Your caregiver may give you a pain pill to take, 1 or 2 hours before the procedure.   Sometimes, a paracervical block may be used to block and control any discomfort with insertion.   A tool (speculum) is then placed in your vagina (birth canal) so your caregiver can see the cervix.   A sound is sent into the uterus to check the depth of the uterus.   A slim instrument (IUD inserter), which is shaped like a drinking straw, is inserted through the small opening in your cervix and into your uterus.   Then, the IUD is pushed in with a plunger, much like a syringe, and the inserter is removed. There may be some cramping and pain during the insertion.   Relaxing helps to lessen the discomfort.   Following the procedure, you will usually spot blood. Have some pads with you. Avoid using tampons for 2 weeks. Bleeding after the procedure is normal. It varies from light  spotting for a few days to menstrual-like bleeding for up to 3 weeks. It may be like a period if it is near the time for your normal period.   You may also have mild cramping. Only take over-the-counter or prescription medicines for pain, discomfort, or fever as directed by your caregiver. Do not use aspirin, as this may increase bleeding.   Practice checking the string coming out of the cervix, to make sure the IUD is always in the uterus.  HOME CARE INSTRUCTIONS   Do not drive for 24 hours.   Only take over-the-counter or prescription medicines for pain, discomfort, or fever as directed by your caregiver.   No tampons, douching, or sexual intercourse for   2 weeks, or until your caregiver approves.   Rest at home for 24 hours. You may then resume normal activities, unless told otherwise by your caregiver.   Check your IUD prior to resuming sexual activity, to make sure it is in place. Make sure that you can feel the strings. An IUD can be pushed out and lost without the user even knowing it is gone. Also, if the strings are getting longer, it may mean that the IUD is being forced out of the uterus. This means it is no longer offering full protection from pregnancy.   Take any medications your caregiver has ordered, as directed.   Make sure to keep your recheck appointment, so your caregiver can make sure your IUD has remained in place. After that exam, yearly exams are advised, unless you cannot feel the strings of your IUD.   Check that the IUD is still in place by feeling for the strings after every menstrual period.  SEEK MEDICAL CARE IF:   Bleeding is heavier than a normal menstrual cycle.   You have an oral temperature above 102 F (38.9 C).   You have increasing cramps or pains, not relieved with medicine. Or you develop belly (abdominal) pain that does not seem to be related to the same area of earlier cramping and pain.   You are lightheaded, unusually weak, or have fainting  episodes.   You develop pain in the tops of your shoulders (shoulder strap areas).   You are having problems or questions, which have not been answered well enough by your caregiver.   You develop abdominal pain.   You have pain during sexual intercourse.   You cannot feel the IUD strings.   You have abnormal vaginal discharge.   You feel the IUD at the opening of the cervix in the vagina.   You think you are pregnant.   You miss your menstrual period.   The IUD string is hurting your sex partner.   The IUD string has gotten longer.  Document Released: 09/19/2004 Document Revised: 01/31/2011 Document Reviewed: 11/01/2009 [employer] Patient Information 2012 [employer] 

## 2011-08-28 NOTE — Progress Notes (Signed)
Subjective:     Karen Khan is a 25 y.o. female here for an IUD string check.  Current complaints: intermittent sharp pelvic pains.    Gynecologic History No LMP recorded. Patient is not currently having periods (Reason: IUD).   Obstetric History OB History    Grav Para Term Preterm Abortions TAB SAB Ect Mult Living   4 3 3  0 0 0 0 0 0 3     # Outc Date GA Lbr Len/2nd Wgt Sex Del Anes PTL Lv   1 TRM 3/05 [redacted]w[redacted]d  6lb10oz(3.005kg) F SVD EPI No    2 TRM 12/07 [redacted]w[redacted]d  8lb3oz(3.714kg) M SVD EPI No Yes   3 TRM 7/12 104w0d 00:00 7lb9.5oz(3.445kg) M SVD EPI  Yes   4 GRA            Comments: System Generated. Please review and update pregnancy details.         Review of Systems Pertinent items are noted in HPI.    Objective:    General appearance: alert, cooperative and no distress Pelvic: cervix normal in appearance, external genitalia normal, no adnexal masses or tenderness, no cervical motion tenderness, uterus normal size, shape, and consistency, vagina normal without discharge and IUD strings visible protruding from cervical os Skin: Skin color, texture, turgor normal. No rashes or lesions    Assessment:    Normal IUD check.    Plan:    F/U for worsening pain or if pain is ongoing past 3-4 months after IUD placement.

## 2013-02-26 ENCOUNTER — Ambulatory Visit: Payer: Self-pay | Admitting: Advanced Practice Midwife

## 2013-02-26 DIAGNOSIS — Z30431 Encounter for routine checking of intrauterine contraceptive device: Secondary | ICD-10-CM

## 2013-02-26 DIAGNOSIS — N949 Unspecified condition associated with female genital organs and menstrual cycle: Secondary | ICD-10-CM

## 2013-04-01 ENCOUNTER — Emergency Department
Admission: EM | Admit: 2013-04-01 | Discharge: 2013-04-01 | Disposition: A | Discharge status: Home / Self Care | Payer: Self-pay | Source: Home / Self Care | Attending: Family Medicine | Admitting: Family Medicine

## 2013-04-01 ENCOUNTER — Encounter: Payer: Self-pay | Admitting: *Deleted

## 2013-04-01 ENCOUNTER — Emergency Department (INDEPENDENT_AMBULATORY_CARE_PROVIDER_SITE_OTHER): Payer: Self-pay

## 2013-04-01 DIAGNOSIS — M7918 Myalgia, other site: Secondary | ICD-10-CM

## 2013-04-01 DIAGNOSIS — IMO0001 Reserved for inherently not codable concepts without codable children: Secondary | ICD-10-CM

## 2013-04-01 DIAGNOSIS — M62838 Other muscle spasm: Secondary | ICD-10-CM

## 2013-04-01 DIAGNOSIS — M542 Cervicalgia: Secondary | ICD-10-CM

## 2013-04-01 MED ORDER — MELOXICAM 15 MG PO TABS: 15.0000 mg | ORAL_TABLET | Freq: Every day | ORAL | Status: DC

## 2013-04-01 MED ORDER — CYCLOBENZAPRINE HCL 10 MG PO TABS: 10.0000 mg | ORAL_TABLET | Freq: Two times a day (BID) | ORAL | Status: DC | PRN

## 2013-04-01 NOTE — ED Notes (Signed)
Karen Khan reports sudden sharp pain this AM that goes from the right side of her neck down to her upper right abdomen. Denies any other associated symptoms.

## 2013-04-01 NOTE — ED Provider Notes (Signed)
History     CSN: [id number]  Arrival date & time 04/01/13  1033   None     Chief Complaint  Patient presents with  . Neck Pain  . Abdominal Pain       HPI Comments: Patient noticed stiffness in her right neck upon awakening this morning.  Later, she developed pain in her right neck radiating into her right scapula.  She now has intermittent tingling in her right arm.  No systemic symptoms.  She recalls no injury or recent change in physical activities.  She notes that she holds her child on her left side.  Patient is a 27 y.o. female presenting with neck injury. The history is provided by the patient.  Neck Injury This is a new problem. The current episode started 3 to 5 hours ago. The problem occurs constantly. The problem has been gradually worsening. Pertinent negatives include no chest pain, no abdominal pain, no headaches and no shortness of breath. Exacerbated by: movement of neck and right shoulder. Nothing relieves the symptoms. She has tried nothing for the symptoms.    Past Medical History  Diagnosis Date  . No pertinent past medical history     Past Surgical History  Procedure Laterality Date  . No past surgeries      Family History  Problem Relation Age of Onset  . Heart disease Mother   . Atrial fibrillation Mother   . Heart attack Mother   . Diabetes Paternal Uncle     History  Substance Use Topics  . Smoking status: Former Smoker -- 3.00 packs/day for 6 years    Quit date: 04/01/2008  . Smokeless tobacco: Not on file  . Alcohol Use: No    OB History   Grav Para Term Preterm Abortions TAB SAB Ect Mult Living   4 3 3  0 0 0 0 0 0 3      Review of Systems  Respiratory: Negative for shortness of breath.   Cardiovascular: Negative for chest pain.  Gastrointestinal: Negative for abdominal pain.  Neurological: Negative for headaches.  All other systems reviewed and are negative.    Allergies  Tramadol hcl  Home Medications   Current Outpatient  Rx  Name  Route  Sig  Dispense  Refill  . cyclobenzaprine (FLEXERIL) 10 MG tablet   Oral   Take 1 tablet (10 mg total) by mouth 2 (two) times daily as needed for muscle spasms.   20 tablet   0   . levonorgestrel (MIRENA) 20 MCG/24HR IUD   Intrauterine   1 each by Intrauterine route once.           . meloxicam (MOBIC) 15 MG tablet   Oral   Take 1 tablet (15 mg total) by mouth daily. Take with food each morning   15 tablet   0     BP 110/74  Pulse 95  Resp 14  Ht 5\' 5"  (1.651 m)  Wt 187 lb (84.823 kg)  BMI 31.12 kg/m2  SpO2 98%  Physical Exam Nursing notes and Vital Signs reviewed. Appearance:  Patient appears stated age, and in no acute distress.  Patient is obese (BMI 31.1) Eyes:  Pupils are equal, round, and reactive to light and accomodation.  Extraocular movement is intact.  Conjunctivae are not inflamed  Nose:  Normal Pharynx:  Normal Neck:  Supple.  No adenopathy.  There is tenderness over the right sternocleidomastoid muscle and trapezius muscle  Lungs:  Clear to auscultation.  Breath sounds are  equal.  Heart:  Regular rate and rhythm without murmurs, rubs, or gallops.  Abdomen:  Nontender  Extremities:  No edema.  No calf tenderness Skin:  No rash present.   Upper back:  RIght upper back reveals distinct tenderness over medial and inferior edges of scapula.  Pain is elicited by resisted abduction of shoulder while palpating those areas.  Neuro:  Normal strength/sensation in left arm. ED Course  Procedures  none   Dg Cervical Spine 2-3 Views  04/01/2013   *RADIOLOGY REPORT*  Clinical Data: Right lateral neck pain, no known injury  CERVICAL SPINE - 2-3 VIEW  Comparison: None.  Findings: Cervical spine visualize to C7-T1 on the lateral view.  Mild reversal of the normal cervical lordosis.  No evidence of fracture or dislocation.  The vertebral body heights and intervertebral disc spaces are maintained.  Dens appears intact.  Lateral masses of C1 are symmetric.  No  prevertebral soft tissue swelling.  Visualized lung apices are clear.  IMPRESSION: Normal cervical spine radiographs.   Original Report Authenticated By: Charline Bills, M.D.     1. Neck muscle spasm   2. Rhomboid muscle pain       MDM  Begin Mobic and Flexeril. Begin applying ice pack for 20 minutes, 3 to 4 times daily.  In two to three days begin range of motion and stretching exercises as per instruction sheets ([facility] information and instruction handout given)  Avoid heavy lifting. Followup with [facility] if not improving about two weeks.         Lattie Haw, MD 04/04/13 [phone]

## 2013-04-05 ENCOUNTER — Telehealth: Payer: Self-pay

## 2013-04-05 NOTE — ED Notes (Signed)
Unable to reach patient due to incorrect number.

## 2013-05-28 ENCOUNTER — Encounter: Payer: Self-pay | Admitting: Obstetrics & Gynecology

## 2013-05-28 ENCOUNTER — Ambulatory Visit (INDEPENDENT_AMBULATORY_CARE_PROVIDER_SITE_OTHER): Payer: Self-pay | Admitting: Obstetrics & Gynecology

## 2013-05-28 VITALS — BP 121/78 | HR 93 | Resp 16 | Ht 65.0 in | Wt 196.0 lb

## 2013-05-28 DIAGNOSIS — IMO0001 Reserved for inherently not codable concepts without codable children: Secondary | ICD-10-CM

## 2013-05-28 DIAGNOSIS — Z30432 Encounter for removal of intrauterine contraceptive device: Secondary | ICD-10-CM

## 2013-05-28 MED ORDER — NORETHIN-ETH ESTRAD-FE BIPHAS 1 MG-10 MCG / 10 MCG PO TABS: 1.0000 | ORAL_TABLET | Freq: Every day | ORAL | Status: DC

## 2013-05-28 NOTE — Progress Notes (Signed)
  Subjective:    Patient ID: Karen Khan, female    DOB: 10/29/1986, 27 y.o.   MRN: [id number]  HPI 27 yo lady who is here today to have her Mirena removed. She is convinced that it is responsible for her weight gain and unusual hemiparesthesias. I have stressed that I do not believe this to be the case. She is adament that she wants it removed. She wants to restart OCPs. She declines waiting to have the IUD removed until the OCPs kick in. She plans to use condoms for the next month.   Review of Systems     Objective:   Physical Exam  Mirena IUD easily removed with 1 pull.  She tolerated the procedure well.     Assessment & Plan:   Desire for IUD removal. It has now been removed and OCPs prescribed. I have also recommended a daily MVI. RTC for BP check

## 2013-09-24 ENCOUNTER — Encounter: Payer: Self-pay | Admitting: Obstetrics & Gynecology

## 2013-09-24 ENCOUNTER — Other Ambulatory Visit: Payer: Medicaid Other

## 2013-09-24 ENCOUNTER — Other Ambulatory Visit (HOSPITAL_COMMUNITY)
Admission: RE | Admit: 2013-09-24 | Discharge: 2013-09-24 | Disposition: A | Discharge status: Home / Self Care | Payer: Medicaid Other | Source: Ambulatory Visit | Attending: Obstetrics & Gynecology | Admitting: Obstetrics & Gynecology

## 2013-09-24 ENCOUNTER — Ambulatory Visit (INDEPENDENT_AMBULATORY_CARE_PROVIDER_SITE_OTHER): Payer: Medicaid Other | Admitting: Obstetrics & Gynecology

## 2013-09-24 VITALS — BP 126/78 | Wt 201.0 lb

## 2013-09-24 DIAGNOSIS — Z113 Encounter for screening for infections with a predominantly sexual mode of transmission: Secondary | ICD-10-CM | POA: Insufficient documentation

## 2013-09-24 DIAGNOSIS — Z3490 Encounter for supervision of normal pregnancy, unspecified, unspecified trimester: Secondary | ICD-10-CM | POA: Insufficient documentation

## 2013-09-24 DIAGNOSIS — Z01419 Encounter for gynecological examination (general) (routine) without abnormal findings: Secondary | ICD-10-CM | POA: Insufficient documentation

## 2013-09-24 DIAGNOSIS — Z23 Encounter for immunization: Secondary | ICD-10-CM

## 2013-09-24 DIAGNOSIS — Z3491 Encounter for supervision of normal pregnancy, unspecified, first trimester: Secondary | ICD-10-CM

## 2013-09-24 DIAGNOSIS — Z3480 Encounter for supervision of other normal pregnancy, unspecified trimester: Secondary | ICD-10-CM

## 2013-09-24 LAB — HIV ANTIBODY (ROUTINE TESTING W REFLEX): HIV: NONREACTIVE

## 2013-09-24 MED ORDER — INFLUENZA VAC SPLIT QUAD 0.5 ML IM SUSP: 0.5000 mL | Freq: Once | INTRAMUSCULAR | Status: AC

## 2013-09-24 NOTE — Progress Notes (Signed)
   Subjective:    Karen Khan is a [id number] [redacted]w[redacted]d being seen today for her first obstetrical visit.  Her obstetrical history is significant for obesity. Patient does intend to breast feed. Pregnancy history fully reviewed.  3 term NSVDs  Patient reports no complaints.  Filed Vitals:   09/24/13 0929  BP: 126/78  Weight: 201 lb (91.173 kg)    HISTORY: OB History  Gravida Para Term Preterm AB SAB TAB Ectopic Multiple Living  4 3 3  0 0 0 0 0 0 3    # Outcome Date GA Lbr Len/2nd Weight Sex Delivery Anes PTL Lv  4 CUR           3 TRM 05/11/11 [redacted]w[redacted]d  7 lb 9.5 oz (3.445 kg) M SVD EPI  Y  2 TRM 10/10/06 [redacted]w[redacted]d  8 lb 3 oz (3.714 kg) M SVD EPI N Y  1 TRM 12/31/03 [redacted]w[redacted]d  6 lb 10 oz (3.005 kg) F SVD EPI N      Past Medical History  Diagnosis Date  . No pertinent past medical history    Past Surgical History  Procedure Laterality Date  . No past surgeries     Family History  Problem Relation Age of Onset  . Heart disease Mother   . Atrial fibrillation Mother   . Heart attack Mother   . Diabetes Paternal Uncle      Exam    Uterus:     Pelvic Exam:    Perineum: No Hemorrhoids   Vulva: normal   Vagina:  normal mucosa   pH: n/a   Cervix: no lesions   Adnexa: normal adnexa   Bony Pelvis: proven to 8 pounds  System: Breast:  normal appearance, no masses or tenderness   Skin: normal coloration and turgor, no rashes   Neurologic: oriented, normal   Extremities: no deformities, no erythema, induration, or nodules   HEENT sclera clear, anicteric2   Mouth/Teeth mucous membranes moist, pharynx normal without lesions and dental hygiene poor   Neck supple and no masses   Cardiovascular: regular rate and rhythm   Respiratory:  appears well, vitals normal, no respiratory distress, acyanotic, normal RR   Abdomen: normal findings: soft, nontender   Urinary: urethral meatus normal      Assessment:    Pregnancy: [id number] Patient Active Problem List   Diagnosis Date Noted  .  Candidal skin infection 07/05/2011  . Normal vaginal delivery 05/13/2011  . FINGER SPRAIN 07/24/2009  . DENTAL PAIN 03/04/2009  . LOW BACK PAIN, CHRONIC 02/12/2009        Plan:     Initial labs drawn. Prenatal vitamins. Problem list reviewed and updated. Genetic Screening discussed First Screen: ordered.  Ultrasound discussed; fetal survey: requested.  Follow up in 4 weeks.  Bedside US shows 5 weeks 5 day pregnancy with FH of 97.  Subchorionic hemorrhage.  Will have pt f/u in radiology in 2 weeks for dating and viability.   LEGGETT,KELLY H. 09/24/2013

## 2013-09-24 NOTE — Progress Notes (Signed)
p-108  Last pap 12/11  NOB intake.  Bedside U/S showed CRL of 2.74mm  GA measures [redacted]w[redacted]d  FHT 97.  Per Dr Penne Lash repeat U/S in 2 weeks downstairs in imaging.

## 2013-09-25 LAB — OBSTETRIC PANEL
Basophils Absolute: 0 10*3/uL (ref 0.0–0.1)
Basophils Relative: 0 % (ref 0–1)
Hemoglobin: 13.7 g/dL (ref 12.0–15.0)
Hepatitis B Surface Ag: NEGATIVE
MCHC: 33.6 g/dL (ref 30.0–36.0)
Monocytes Relative: 5 % (ref 3–12)
Neutro Abs: 7.8 10*3/uL — ABNORMAL HIGH (ref 1.7–7.7)
Neutrophils Relative %: 73 % (ref 43–77)
Rh Type: POSITIVE

## 2013-09-28 ENCOUNTER — Encounter: Payer: Self-pay | Admitting: Obstetrics & Gynecology

## 2013-10-03 ENCOUNTER — Ambulatory Visit (INDEPENDENT_AMBULATORY_CARE_PROVIDER_SITE_OTHER): Payer: Medicaid Other | Admitting: Family

## 2013-10-03 VITALS — BP 129/81 | Wt 203.0 lb

## 2013-10-03 DIAGNOSIS — Z3491 Encounter for supervision of normal pregnancy, unspecified, first trimester: Secondary | ICD-10-CM

## 2013-10-03 DIAGNOSIS — Z3480 Encounter for supervision of other normal pregnancy, unspecified trimester: Secondary | ICD-10-CM

## 2013-10-03 DIAGNOSIS — R1033 Periumbilical pain: Secondary | ICD-10-CM

## 2013-10-03 NOTE — Progress Notes (Signed)
Increase pain around umbilicus   p-107  Bedside U/S showed CRL 10.1 and GA [redacted]w[redacted]d.  Subchorianic bleed not viewed today

## 2013-10-03 NOTE — Progress Notes (Signed)
Reviewed OB labs.  Desires First Screen for genetic testing > schedule at next visit.

## 2013-10-13 ENCOUNTER — Other Ambulatory Visit: Payer: Medicaid Other

## 2013-10-13 ENCOUNTER — Ambulatory Visit (HOSPITAL_BASED_OUTPATIENT_CLINIC_OR_DEPARTMENT_OTHER)
Admission: RE | Admit: 2013-10-13 | Discharge: 2013-10-13 | Disposition: A | Discharge status: Home / Self Care | Payer: Medicaid Other | Source: Ambulatory Visit | Attending: Obstetrics & Gynecology | Admitting: Obstetrics & Gynecology

## 2013-10-13 ENCOUNTER — Other Ambulatory Visit: Payer: Self-pay | Admitting: Obstetrics & Gynecology

## 2013-10-13 DIAGNOSIS — Z3491 Encounter for supervision of normal pregnancy, unspecified, first trimester: Secondary | ICD-10-CM

## 2013-10-13 DIAGNOSIS — Z1389 Encounter for screening for other disorder: Secondary | ICD-10-CM | POA: Insufficient documentation

## 2013-10-13 DIAGNOSIS — Z363 Encounter for antenatal screening for malformations: Secondary | ICD-10-CM | POA: Insufficient documentation

## 2013-10-27 ENCOUNTER — Encounter: Payer: Self-pay | Admitting: Advanced Practice Midwife

## 2013-10-27 ENCOUNTER — Ambulatory Visit (INDEPENDENT_AMBULATORY_CARE_PROVIDER_SITE_OTHER): Payer: Medicaid Other | Admitting: Advanced Practice Midwife

## 2013-10-27 VITALS — BP 119/72 | Wt 203.0 lb

## 2013-10-27 DIAGNOSIS — Z3480 Encounter for supervision of other normal pregnancy, unspecified trimester: Secondary | ICD-10-CM

## 2013-10-27 DIAGNOSIS — Z3481 Encounter for supervision of other normal pregnancy, first trimester: Secondary | ICD-10-CM

## 2013-10-27 NOTE — Progress Notes (Signed)
Doing well.  Denies vaginal bleeding, LOF, cramping.  Unable to obtain FHR with doppler.  U/S in office with normal FHR.  First screen scheduled.

## 2013-10-27 NOTE — Progress Notes (Signed)
p=107 

## 2013-11-06 ENCOUNTER — Other Ambulatory Visit: Payer: Self-pay | Admitting: Obstetrics & Gynecology

## 2013-11-06 DIAGNOSIS — Z3682 Encounter for antenatal screening for nuchal translucency: Secondary | ICD-10-CM

## 2013-11-10 ENCOUNTER — Other Ambulatory Visit (HOSPITAL_COMMUNITY): Payer: Medicaid Other

## 2013-11-10 ENCOUNTER — Ambulatory Visit (HOSPITAL_COMMUNITY)
Admission: RE | Admit: 2013-11-10 | Discharge: 2013-11-10 | Disposition: A | Discharge status: Home / Self Care | Payer: Medicaid Other | Source: Ambulatory Visit | Attending: Obstetrics & Gynecology | Admitting: Obstetrics & Gynecology

## 2013-11-24 ENCOUNTER — Encounter: Payer: Medicaid Other | Admitting: Advanced Practice Midwife

## 2013-11-26 NOTE — Progress Notes (Signed)
Error, no show  This encounter was created in error - please disregard. 

## 2014-04-21 IMAGING — US US OB COMP LESS 14 WK
1 series · 14 of 28 positions shown · non-contrast
Comparison: None.

CLINICAL DATA: Dating, viability

EXAM:
OBSTETRIC <14 WK ULTRASOUND
TECHNIQUE: Transabdominal ultrasound was performed for evaluation of the
gestation as well as the maternal uterus and adnexal regions.

[Series 1: us ob comp less 14 wk · 0.26mm/px · 14 of 47 slices shown]
[im 2/47]
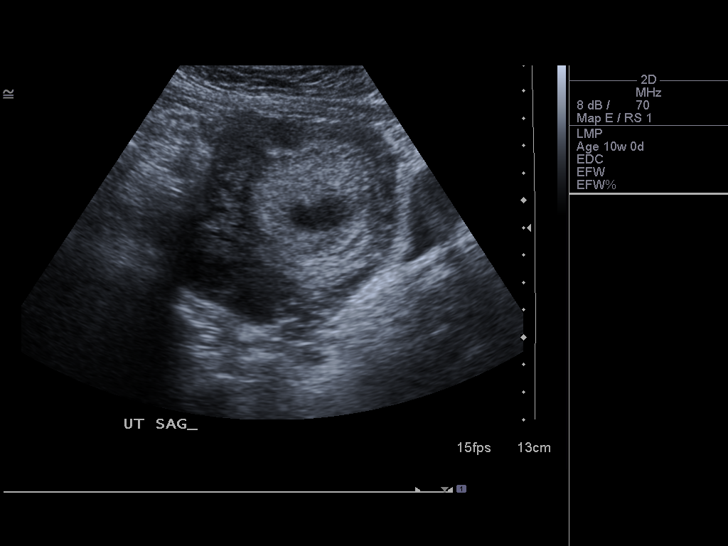
[im 6/47]
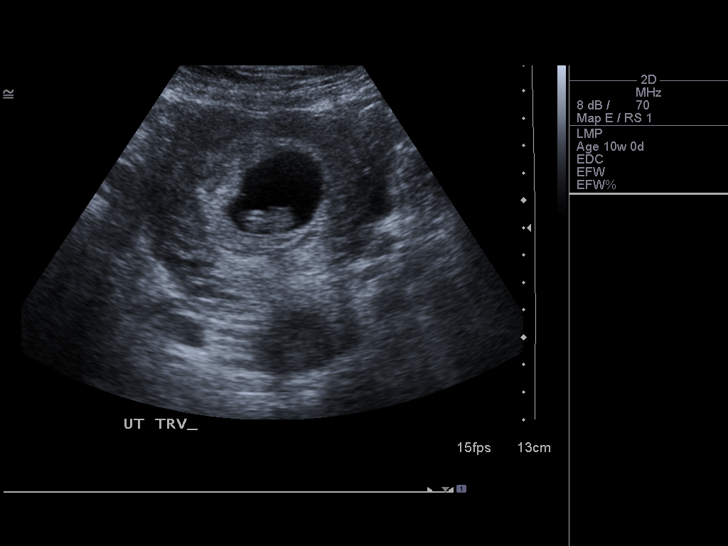
[im 9/47]
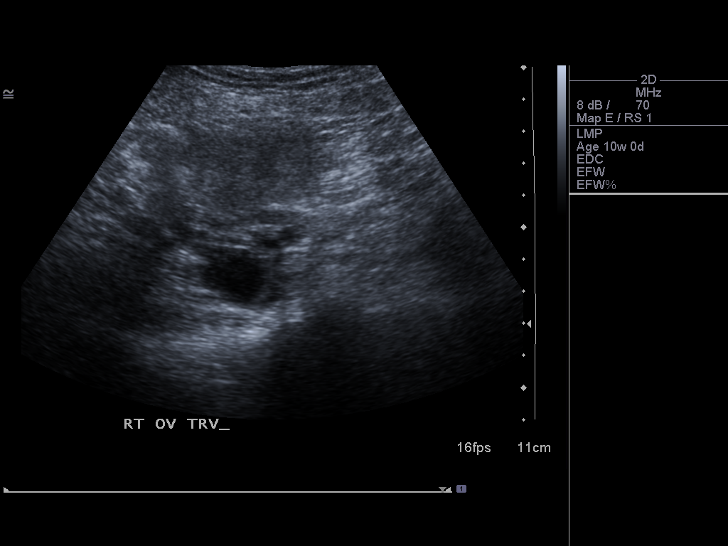
[im 12/47]
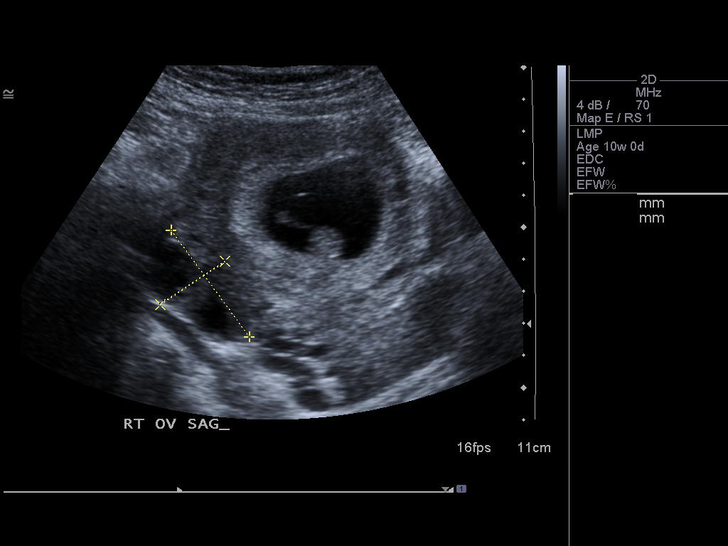
[im 16/47]
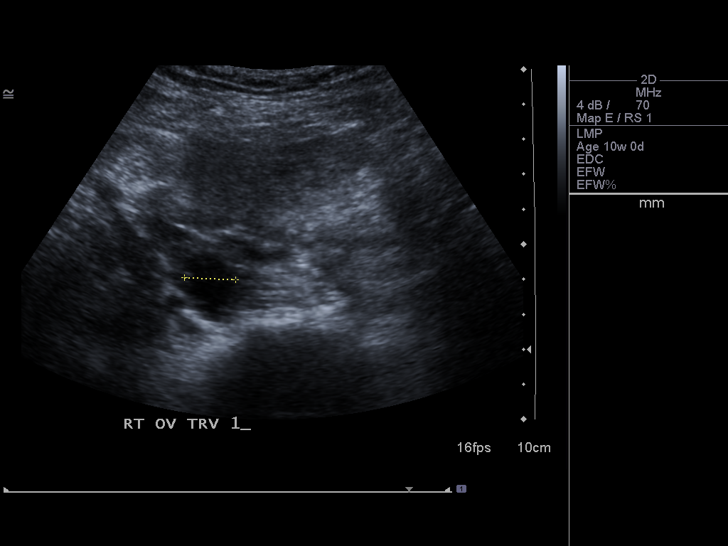
[im 19/47]
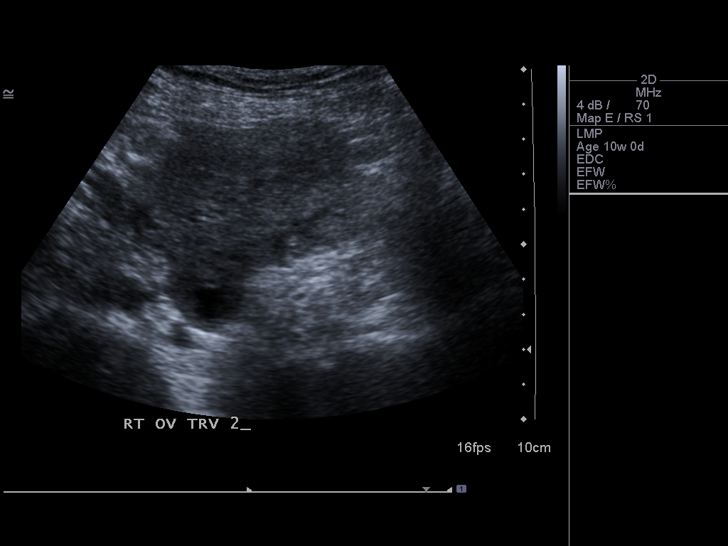
[im 23/47]
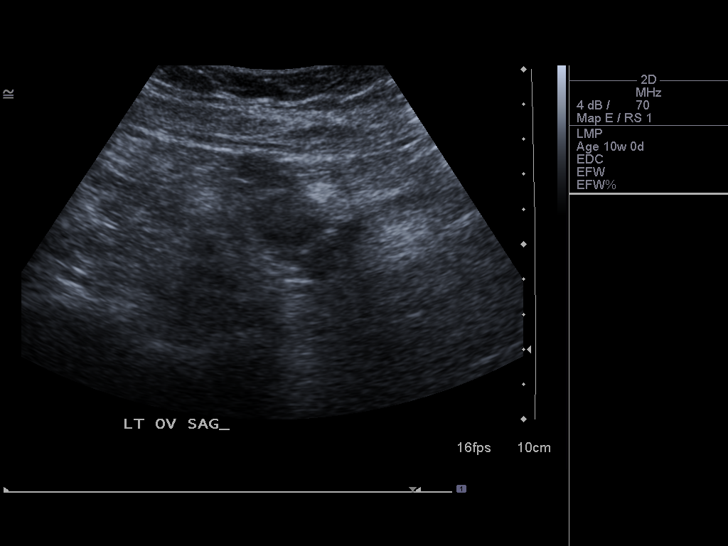
[im 26/47]
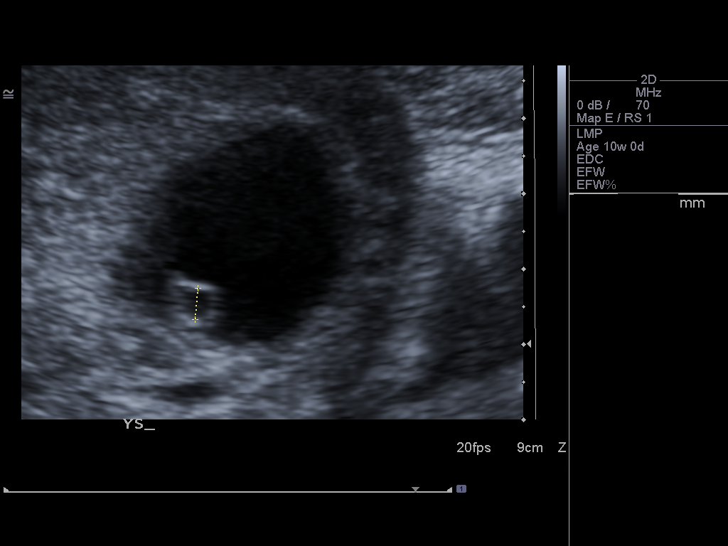
[im 29/47]
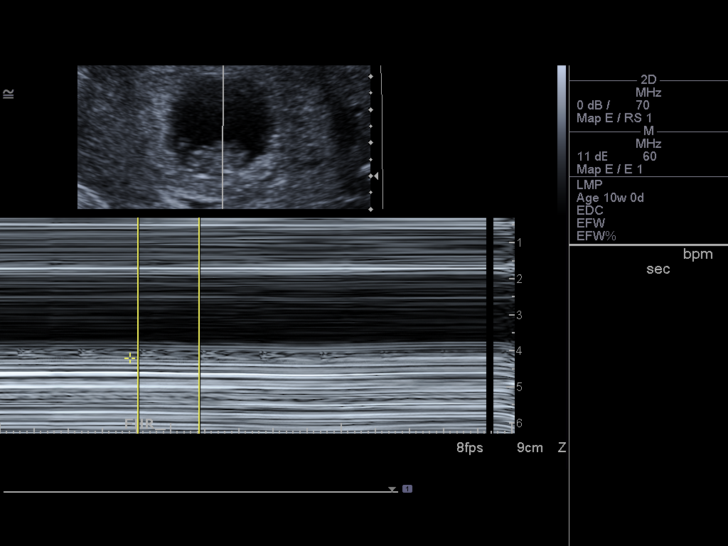
[im 33/47]
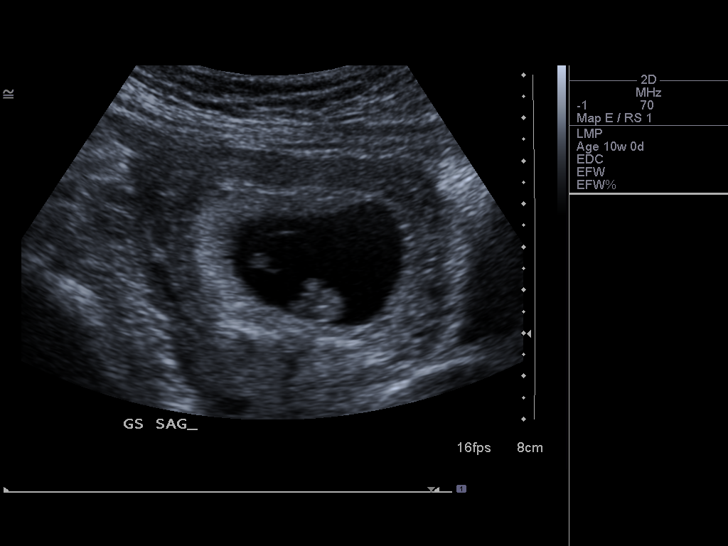
[im 36/47]
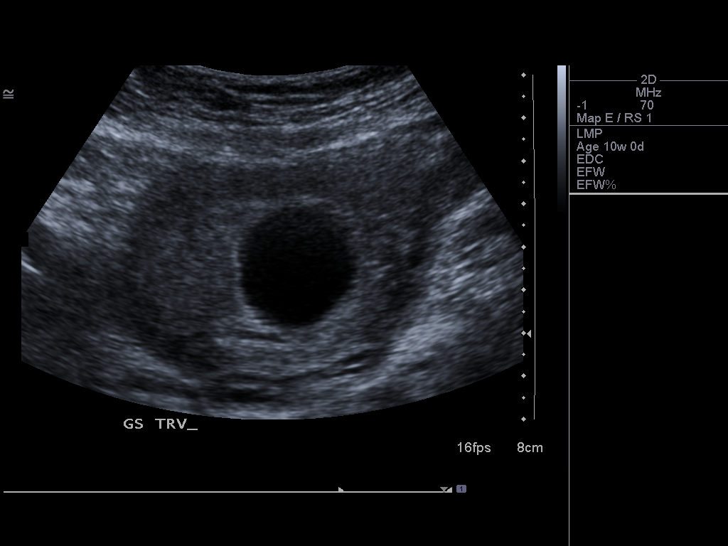
[im 40/47]
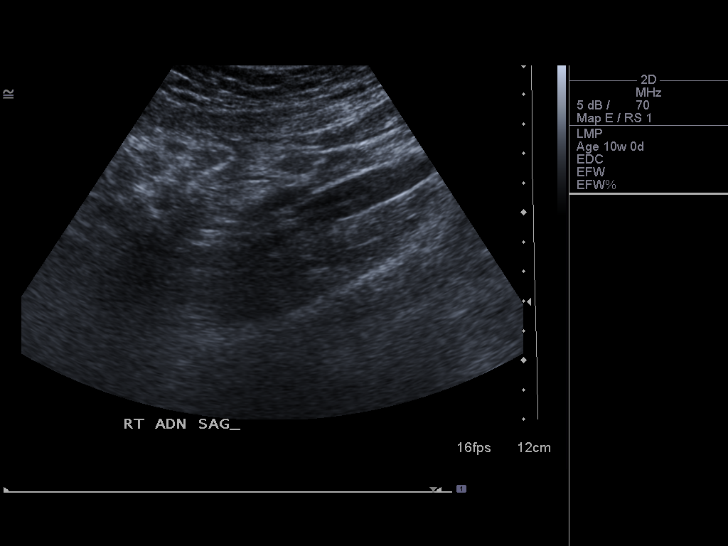
[im 43/47]
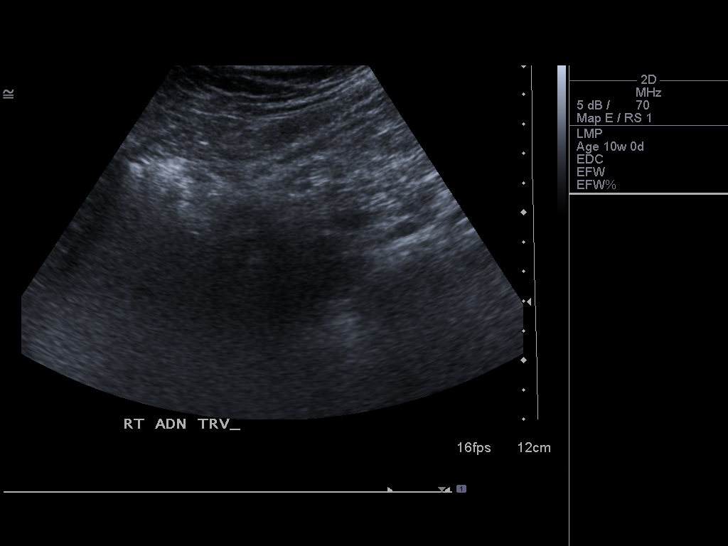
[im 47/47]
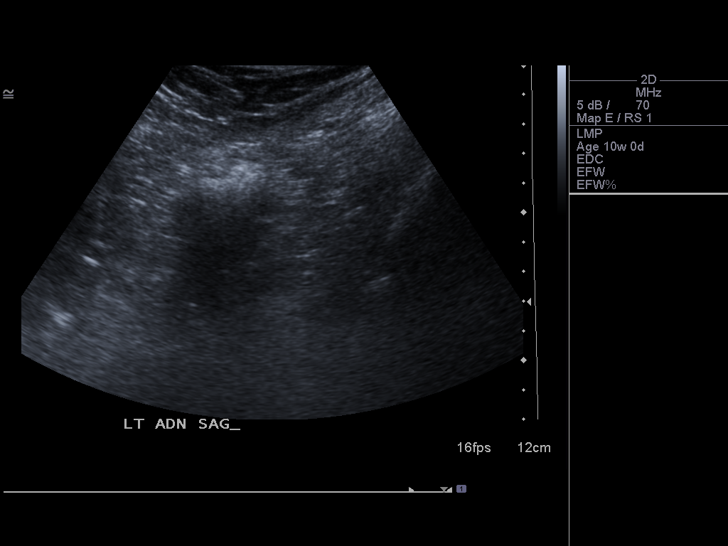

[14 of 28 positions shown; findings below may reference images not displayed]

FINDINGS: Intrauterine gestational sac: Visualized/normal in shape.

Yolk sac:  Visualized

Embryo:  Visualized

Cardiac Activity: Visualized

Heart Rate: 168 bpm

MSD:   mm    w     d

CRL:   20.8  mm   8 w 5 d                  US EDC: 05/20/2014

Maternal uterus/adnexae: No adnexal masses. No free fluid. No
subchorionic hemorrhage.
IMPRESSION: Eight week 5 day intrauterine pregnancy. Fetal heart rate 168 beats
per min. No acute maternal findings.

## 2014-07-30 ENCOUNTER — Encounter (HOSPITAL_COMMUNITY): Payer: Self-pay | Admitting: *Deleted

## 2014-08-31 ENCOUNTER — Encounter (HOSPITAL_COMMUNITY): Payer: Self-pay | Admitting: *Deleted

## 2020-09-13 ENCOUNTER — Ambulatory Visit: Payer: Self-pay

## 2020-09-24 ENCOUNTER — Other Ambulatory Visit: Payer: Self-pay

## 2020-09-24 ENCOUNTER — Emergency Department (INDEPENDENT_AMBULATORY_CARE_PROVIDER_SITE_OTHER)
Admission: RE | Admit: 2020-09-24 | Discharge: 2020-09-24 | Disposition: A | Discharge status: Home / Self Care | Payer: Self-pay | Source: Ambulatory Visit

## 2020-09-24 VITALS — BP 114/81 | HR 91 | Temp 98.9°F | Resp 18

## 2020-09-24 DIAGNOSIS — S39012A Strain of muscle, fascia and tendon of lower back, initial encounter: Secondary | ICD-10-CM

## 2020-09-24 HISTORY — DX: Gastro-esophageal reflux disease without esophagitis: K21.9

## 2020-09-24 MED ORDER — TIZANIDINE HCL 4 MG PO TABS: 4.0000 mg | ORAL_TABLET | Freq: Four times a day (QID) | ORAL | 0 refills | Status: DC | PRN

## 2020-09-24 MED ORDER — MELOXICAM 15 MG PO TABS: 15.0000 mg | ORAL_TABLET | Freq: Every day | ORAL | 1 refills | Status: DC

## 2020-09-24 MED ORDER — KETOROLAC TROMETHAMINE 60 MG/2ML IM SOLN
60.0000 mg | Freq: Once | INTRAMUSCULAR | Status: AC
  Administered 2020-09-24: 60 mg via INTRAMUSCULAR

## 2020-09-24 MED ORDER — PREDNISONE 20 MG PO TABS: 40.0000 mg | ORAL_TABLET | Freq: Every day | ORAL | 0 refills | Status: DC

## 2020-09-24 MED ORDER — DEXAMETHASONE SODIUM PHOSPHATE 10 MG/ML IJ SOLN
10.0000 mg | Freq: Once | INTRAMUSCULAR | Status: AC
  Administered 2020-09-24: 10 mg via INTRAMUSCULAR

## 2020-09-24 NOTE — ED Triage Notes (Addendum)
Pt presents to Urgent Care with c/o lower back pain which radiates down R leg x approx 2 weeks. Pt reports pain began the day after she was doing some lifting. Pt denies any numbness/tingling. She has been taking ibuprofen and alternating heat/ice w/o significant relief. Pt reports pain is much worse this AM.

## 2020-09-24 NOTE — ED Provider Notes (Signed)
[facility]    CSN: [id number] Arrival date & time: 09/24/20  1153      History   Chief Complaint Chief Complaint  Patient presents with  . Back Pain    HPI Karen Khan is a 34 y.o. female.   HPI  Back Pain: Patient presents for presents evaluation of low back problems.  Symptoms have been present for 2 weeks and include shooting, aching, dull pain radiating to lower right leg. Denies any concerning neurological symptoms. She has taken OTC medication without relief of symptoms. Past Medical History:  Diagnosis Date  . GERD (gastroesophageal reflux disease)   . No pertinent past medical history     Patient Active Problem List   Diagnosis Date Noted  . Supervision of low-risk pregnancy 09/24/2013  . Candidal skin infection 07/05/2011  . Normal vaginal delivery 05/13/2011  . FINGER SPRAIN 07/24/2009  . DENTAL PAIN 03/04/2009  . LOW BACK PAIN, CHRONIC 02/12/2009    Past Surgical History:  Procedure Laterality Date  . NO PAST SURGERIES    . TUBAL LIGATION      OB History    Gravida  4   Para  3   Term  3   Preterm  0   AB  0   Living  3     SAB  0   TAB  0   Ectopic  0   Multiple  0   Live Births  2            Home Medications    Prior to Admission medications   Medication Sig Start Date End Date Taking? Authorizing Provider  ibuprofen (ADVIL) 200 MG tablet Take 400 mg by mouth every 6 (six) hours as needed.   Yes [provider]  prenatal vitamin w/FE, FA (NATACHEW) 29-1 MG CHEW chewable tablet Chew 1 tablet by mouth daily at 12 noon.    [provider]    Family History Family History  Problem Relation Age of Onset  . Heart disease Mother   . Atrial fibrillation Mother   . Heart attack Mother   . Diabetes Paternal Uncle     Social History Social History   Tobacco Use  . Smoking status: Former Smoker    Packs/day: 1.00    Years: 6.00    Pack years: 6.00    Types: Cigarettes    Quit date:  09/03/2020    Years since quitting: 0.0  . Smokeless tobacco: Never Used  Substance Use Topics  . Alcohol use: No  . Drug use: No     Allergies   Tramadol hcl  Review of Systems Review of Systems Pertinent negatives listed in HPI   Physical Exam Triage Vital Signs ED Triage Vitals  Enc Vitals Group     BP 09/24/20 1409 114/81     Pulse Rate 09/24/20 1409 91     Resp 09/24/20 1409 18     Temp 09/24/20 1409 98.9 F (37.2 C)     Temp Source 09/24/20 1409 Oral     SpO2 09/24/20 1409 99 %     Weight --      Height --      Head Circumference --      Peak Flow --      Pain Score 09/24/20 1401 8     Pain Loc --      Pain Edu? --      Excl. in GC? --    No data found.  Updated Vital  Signs BP 114/81 (BP Location: Left Arm)   Pulse 91   Temp 98.9 F (37.2 C) (Oral)   Resp 18   LMP 08/24/2020 Comment: tubal ligation  SpO2 99%   Visual Acuity Right Eye Distance:   Left Eye Distance:   Bilateral Distance:    Right Eye Near:   Left Eye Near:    Bilateral Near:     Physical Exam Constitutional:      Appearance: Normal appearance.  Cardiovascular:     Rate and Rhythm: Normal rate and regular rhythm.  Pulmonary:     Effort: Pulmonary effort is normal.     Breath sounds: Normal breath sounds and air entry.  Musculoskeletal:     Lumbar back: Tenderness present. No swelling or edema. Decreased range of motion. Positive right straight leg raise test and positive left straight leg raise test.       Back:  Neurological:     Mental Status: She is alert.  Psychiatric:        Attention and Perception: Attention normal.        Mood and Affect: Mood normal.      UC Treatments / Results  Labs (all labs ordered are listed, but only abnormal results are displayed) Labs Reviewed - No data to display  EKG   Radiology No results found.  Procedures Procedures (including critical care time)  Medications Ordered in UC Medications  ketorolac (TORADOL) injection  60 mg (60 mg Intramuscular Given 09/24/20 1436)  dexamethasone (DECADRON) injection 10 mg (10 mg Intramuscular Given 09/24/20 1436)    Initial Impression / Assessment and Plan / UC Course  I have reviewed the triage vital signs and the nursing notes.  Pertinent labs & imaging results that were available during my care of the patient were reviewed by me and considered in my medical decision making (see chart for details).    Acute lumbar strain, Toradol and Decadron IM given here in clinic . Start oral prednisone tomorrow, tizanidine PRN as needed for pain, and Meloxicam as needed after completing prednisone. Follow-up with PCP as needed. Final Clinical Impressions(s) / UC Diagnoses   Final diagnoses:  Strain of lumbar region, initial encounter   Discharge Instructions   None    ED Prescriptions    Medication Sig Dispense Auth. Provider   predniSONE (DELTASONE) 20 MG tablet Take 2 tablets (40 mg total) by mouth daily with breakfast. 10 tablet Bing Neighbors, FNP   tiZANidine (ZANAFLEX) 4 MG tablet Take 1 tablet (4 mg total) by mouth every 6 (six) hours as needed for muscle spasms. 30 tablet Bing Neighbors, FNP   meloxicam (MOBIC) 15 MG tablet Take 1 tablet (15 mg total) by mouth daily. 30 tablet Bing Neighbors, FNP     PDMP not reviewed this encounter.   Bing Neighbors, FNP 09/28/20 1037

## 2023-03-28 ENCOUNTER — Ambulatory Visit
Admission: EM | Admit: 2023-03-28 | Discharge: 2023-03-28 | Disposition: A | Discharge status: Home / Self Care | Payer: BLUE CROSS/BLUE SHIELD

## 2023-03-28 DIAGNOSIS — R0981 Nasal congestion: Secondary | ICD-10-CM

## 2023-03-28 DIAGNOSIS — R051 Acute cough: Secondary | ICD-10-CM | POA: Diagnosis not present

## 2023-03-28 DIAGNOSIS — J069 Acute upper respiratory infection, unspecified: Secondary | ICD-10-CM | POA: Diagnosis not present

## 2023-03-28 MED ORDER — IPRATROPIUM BROMIDE 0.06 % NA SOLN: 2.0000 | Freq: Four times a day (QID) | NASAL | 0 refills | Status: DC

## 2023-03-28 MED ORDER — PROMETHAZINE-DM 6.25-15 MG/5ML PO SYRP: 5.0000 mL | ORAL_SOLUTION | Freq: Four times a day (QID) | ORAL | 0 refills | Status: DC | PRN

## 2023-03-28 NOTE — Discharge Instructions (Signed)
URI/COLD SYMPTOMS: Your exam today is consistent with a viral illness. Antibiotics are not indicated at this time. Use medications as directed, including cough syrup, nasal saline, and decongestants. Your symptoms should improve over the next few days and resolve within 7-10 days. Increase rest and fluids. F/u if symptoms worsen or predominate such as sore throat, ear pain, productive cough, shortness of breath, or if you develop high fevers or worsening fatigue over the next several days.    

## 2023-03-28 NOTE — ED Triage Notes (Signed)
Pt presents to UC c/o cough, congestion onset x2 days ago. Pt reports family tested positive for covid last Thursday.

## 2023-03-28 NOTE — ED Provider Notes (Signed)
[facility]    CSN: [id number] Arrival date & time: 03/28/23  4098      History   Chief Complaint Chief Complaint  Patient presents with   Nasal Congestion   Cough    HPI Karen Khan is a 37 y.o. female presenting with her son and partner for 6-day history of cough, congestion and sinus pressure.  Patient denies fever.  Has been fatigued and symptoms have been worse over the past 2 days.  She is concerned about a sinus infection at this time.  Denies sore throat or ear pain.  Has been taking OTC meds without relief.  Her partner and son as well as herself tested positive for COVID 6 days ago.  No other complaints.  HPI  Past Medical History:  Diagnosis Date   GERD (gastroesophageal reflux disease)    No pertinent past medical history     Patient Active Problem List   Diagnosis Date Noted   Supervision of low-risk pregnancy 09/24/2013   Candidal skin infection 07/05/2011   Normal vaginal delivery 05/13/2011   FINGER SPRAIN 07/24/2009   DENTAL PAIN 03/04/2009   LOW BACK PAIN, CHRONIC 02/12/2009    Past Surgical History:  Procedure Laterality Date   NO PAST SURGERIES     TUBAL LIGATION      OB History     Gravida  4   Para  3   Term  3   Preterm  0   AB  0   Living  3      SAB  0   IAB  0   Ectopic  0   Multiple  0   Live Births  2            Home Medications    Prior to Admission medications   Medication Sig Start Date End Date Taking? Authorizing Provider  clonazePAM (KLONOPIN) 0.5 MG tablet Take by mouth. 12/18/22  Yes [provider]  ipratropium (ATROVENT) 0.06 % nasal spray Place 2 sprays into both nostrils 4 (four) times daily. 03/28/23  Yes Shirlee Latch, PA-C  pantoprazole (PROTONIX) 40 MG tablet Take by mouth. 01/27/21  Yes [provider]  promethazine-dextromethorphan (PROMETHAZINE-DM) 6.25-15 MG/5ML syrup Take 5 mLs by mouth 4 (four) times daily as needed. 03/28/23  Yes Eusebio Friendly B, PA-C   sertraline (ZOLOFT) 100 MG tablet Take by mouth. 07/07/22  Yes [provider]  predniSONE (DELTASONE) 20 MG tablet Take 2 tablets (40 mg total) by mouth daily with breakfast. 09/24/20   Bing Neighbors, NP  prenatal vitamin w/FE, FA (NATACHEW) 29-1 MG CHEW chewable tablet Chew 1 tablet by mouth daily at 12 noon.    [provider]  tiZANidine (ZANAFLEX) 4 MG tablet Take 1 tablet (4 mg total) by mouth every 6 (six) hours as needed for muscle spasms. 09/24/20   Bing Neighbors, NP    Family History Family History  Problem Relation Age of Onset   Heart disease Mother    Atrial fibrillation Mother    Heart attack Mother    Diabetes Paternal Uncle     Social History Social History   Tobacco Use   Smoking status: Former    Packs/day: 1.00    Years: 6.00    Additional pack years: 0.00    Total pack years: 6.00    Types: Cigarettes    Quit date: 09/03/2020    Years since quitting: 2.5   Smokeless tobacco: Never  Substance Use Topics  Alcohol use: No   Drug use: No     Allergies   Tramadol hcl, Tramadol hcl, and Phentermine   Review of Systems Review of Systems  Constitutional:  Positive for fatigue. Negative for chills, diaphoresis and fever.  HENT:  Positive for congestion, rhinorrhea and sinus pressure. Negative for ear pain and sore throat.   Respiratory:  Positive for cough. Negative for shortness of breath.   Gastrointestinal:  Negative for abdominal pain, nausea and vomiting.  Musculoskeletal:  Negative for arthralgias and myalgias.  Skin:  Negative for rash.  Neurological:  Negative for weakness and headaches.  Hematological:  Negative for adenopathy.     Physical Exam Triage Vital Signs ED Triage Vitals  Enc Vitals Group     BP 03/28/23 0840 120/79     Pulse Rate 03/28/23 0840 80     Resp --      Temp 03/28/23 0840 98.4 F (36.9 C)     Temp Source 03/28/23 0840 Oral     SpO2 03/28/23 0840 100 %     Weight 03/28/23 0838 185 lb  (83.9 kg)     Height 03/28/23 0838 5\' 5"  (1.651 m)     Head Circumference --      Peak Flow --      Pain Score 03/28/23 0838 5     Pain Loc --      Pain Edu? --      Excl. in GC? --    No data found.  Updated Vital Signs BP 120/79 (BP Location: Left Arm)   Pulse 80   Temp 98.4 F (36.9 C) (Oral)   Ht 5\' 5"  (1.651 m)   Wt 185 lb (83.9 kg)   LMP 03/11/2023 (Exact Date)   SpO2 100%   BMI 30.79 kg/m     Physical Exam Vitals and nursing note reviewed.  Constitutional:      General: She is not in acute distress.    Appearance: Normal appearance. She is not ill-appearing or toxic-appearing.  HENT:     Head: Normocephalic and atraumatic.     Right Ear: Tympanic membrane, ear canal and external ear normal.     Left Ear: Tympanic membrane, ear canal and external ear normal.     Nose: Congestion present.     Mouth/Throat:     Mouth: Mucous membranes are moist.     Pharynx: Oropharynx is clear.  Eyes:     General: No scleral icterus.       Right eye: No discharge.        Left eye: No discharge.     Conjunctiva/sclera: Conjunctivae normal.  Cardiovascular:     Rate and Rhythm: Normal rate and regular rhythm.     Heart sounds: Normal heart sounds.  Pulmonary:     Effort: Pulmonary effort is normal. No respiratory distress.     Breath sounds: Normal breath sounds.  Musculoskeletal:     Cervical back: Neck supple.  Skin:    General: Skin is dry.  Neurological:     General: No focal deficit present.     Mental Status: She is alert. Mental status is at baseline.     Motor: No weakness.     Gait: Gait normal.  Psychiatric:        Mood and Affect: Mood normal.        Behavior: Behavior normal.        Thought Content: Thought content normal.      UC Treatments / Results  Labs (  all labs ordered are listed, but only abnormal results are displayed) Labs Reviewed - No data to display  EKG   Radiology No results found.  Procedures Procedures (including critical  care time)  Medications Ordered in UC Medications - No data to display  Initial Impression / Assessment and Plan / UC Course  I have reviewed the triage vital signs and the nursing notes.  Pertinent labs & imaging results that were available during my care of the patient were reviewed by me and considered in my medical decision making (see chart for details).   37 year old female presents with son and partner for 6-day history of cough, congestion, sinus pressure.  Symptoms worse over the past 2 days.  Tested positive for COVID 6 days ago.  Taking OTC meds without relief.  Vitals normal and stable and she is overall well-appearing.  No acute distress.  On exam she does have nasal congestion.  No evidence of an ear infection.  Throat is clear.  Chest clear to auscultation.  Advised patient symptoms due to COVID can last for 1 to 2 weeks and sometimes longer.  Supportive care advised.  I sent Promethazine DM and Atrovent nasal spray to pharmacy and reviewed current CDC guidelines, isolation protocol and ED precautions.  She declines a work note.   Final Clinical Impressions(s) / UC Diagnoses   Final diagnoses:  Viral upper respiratory tract infection  Acute cough  Nasal congestion     Discharge Instructions      URI/COLD SYMPTOMS: Your exam today is consistent with a viral illness. Antibiotics are not indicated at this time. Use medications as directed, including cough syrup, nasal saline, and decongestants. Your symptoms should improve over the next few days and resolve within 7-10 days. Increase rest and fluids. F/u if symptoms worsen or predominate such as sore throat, ear pain, productive cough, shortness of breath, or if you develop high fevers or worsening fatigue over the next several days.       ED Prescriptions     Medication Sig Dispense Auth. Provider   promethazine-dextromethorphan (PROMETHAZINE-DM) 6.25-15 MG/5ML syrup Take 5 mLs by mouth 4 (four) times daily as needed.  118 mL Eusebio Friendly B, PA-C   ipratropium (ATROVENT) 0.06 % nasal spray Place 2 sprays into both nostrils 4 (four) times daily. 15 mL Shirlee Latch, PA-C      PDMP not reviewed this encounter.   Shirlee Latch, PA-C 03/28/23 [id number]

## 2023-04-23 ENCOUNTER — Ambulatory Visit
Admission: EM | Admit: 2023-04-23 | Discharge: 2023-04-23 | Disposition: A | Discharge status: Home / Self Care | Payer: BLUE CROSS/BLUE SHIELD | Attending: Family Medicine | Admitting: Family Medicine

## 2023-04-23 DIAGNOSIS — R9431 Abnormal electrocardiogram [ECG] [EKG]: Secondary | ICD-10-CM | POA: Diagnosis not present

## 2023-04-23 DIAGNOSIS — Z87891 Personal history of nicotine dependence: Secondary | ICD-10-CM | POA: Diagnosis not present

## 2023-04-23 DIAGNOSIS — R0789 Other chest pain: Secondary | ICD-10-CM | POA: Diagnosis present

## 2023-04-23 DIAGNOSIS — R109 Unspecified abdominal pain: Secondary | ICD-10-CM | POA: Diagnosis not present

## 2023-04-23 DIAGNOSIS — K219 Gastro-esophageal reflux disease without esophagitis: Secondary | ICD-10-CM | POA: Insufficient documentation

## 2023-04-23 DIAGNOSIS — I517 Cardiomegaly: Secondary | ICD-10-CM | POA: Insufficient documentation

## 2023-04-23 LAB — CBC WITH DIFFERENTIAL/PLATELET
Abs Immature Granulocytes: 0.03 10*3/uL (ref 0.00–0.07)
Basophils Absolute: 0.1 10*3/uL (ref 0.0–0.1)
Basophils Relative: 1 %
Eosinophils Absolute: 0.1 10*3/uL (ref 0.0–0.5)
Eosinophils Relative: 1 %
HCT: 38.7 % (ref 36.0–46.0)
Hemoglobin: 13.4 g/dL (ref 12.0–15.0)
Immature Granulocytes: 0 %
Lymphocytes Relative: 24 %
Lymphs Abs: 2.6 10*3/uL (ref 0.7–4.0)
MCH: 28.4 pg (ref 26.0–34.0)
MCHC: 34.6 g/dL (ref 30.0–36.0)
MCV: 82 fL (ref 80.0–100.0)
Monocytes Absolute: 0.4 10*3/uL (ref 0.1–1.0)
Monocytes Relative: 4 %
Neutro Abs: 7.4 10*3/uL (ref 1.7–7.7)
Neutrophils Relative %: 70 %
Platelets: 265 10*3/uL (ref 150–400)
RBC: 4.72 MIL/uL (ref 3.87–5.11)
RDW: 12.9 % (ref 11.5–15.5)
WBC: 10.6 10*3/uL — ABNORMAL HIGH (ref 4.0–10.5)
nRBC: 0 % (ref 0.0–0.2)

## 2023-04-23 LAB — BASIC METABOLIC PANEL
Anion gap: 7 (ref 5–15)
BUN: 11 mg/dL (ref 6–20)
CO2: 24 mmol/L (ref 22–32)
Calcium: 8.8 mg/dL — ABNORMAL LOW (ref 8.9–10.3)
Chloride: 106 mmol/L (ref 98–111)
Creatinine, Ser: 0.54 mg/dL (ref 0.44–1.00)
GFR, Estimated: >60 mL/min (ref >60)
Glucose, Bld: 95 mg/dL (ref 70–99)
Potassium: 3.7 mmol/L (ref 3.5–5.1)
Sodium: 137 mmol/L (ref 135–145)

## 2023-04-23 LAB — TROPONIN I (HIGH SENSITIVITY): Troponin I (High Sensitivity): <2 ng/L (ref <18)

## 2023-04-23 LAB — LIPASE, BLOOD: Lipase: 34 U/L (ref 11–51)

## 2023-04-23 MED ORDER — LIDOCAINE VISCOUS HCL 2 % MT SOLN
15.0000 mL | Freq: Once | OROMUCOSAL | Status: AC
  Administered 2023-04-23: 15 mL via OROMUCOSAL

## 2023-04-23 MED ORDER — FAMOTIDINE 20 MG PO TABS: 20.0000 mg | ORAL_TABLET | Freq: Two times a day (BID) | ORAL | 0 refills | Status: DC

## 2023-04-23 MED ORDER — ALUM & MAG HYDROXIDE-SIMETH 200-200-20 MG/5ML PO SUSP
30.0000 mL | Freq: Once | ORAL | Status: AC
  Administered 2023-04-23: 30 mL via ORAL

## 2023-04-23 NOTE — ED Triage Notes (Addendum)
Pt presents to UC c/o burning sensation in stomach going up to chest. Pt also reports nausea x2 weeks, sinus pain and pressure. Pt reports she was covid positive x a few weeks ago and states still lingering on. Pt reports hx of severe GERD and takes pantoprazole.

## 2023-04-23 NOTE — Discharge Instructions (Signed)
Stop by the pharmacy to pick up your prescriptions.  Follow up with your primary care provider to discuss seeing a gastroenterologist.

## 2023-04-23 NOTE — ED Provider Notes (Signed)
[facility]    CSN: [id number] Arrival date & time: 04/23/23  1254      History   Chief Complaint No chief complaint on file.   HPI Karen Khan is a 37 y.o. female.   HPI  Karen Khan here for burning chest pain discomfort that is not improving with prantoprazole. Reports abdominal pain.     Chest pain began ***  Pain is: *** What worsens the pain: *** What makes the pain better: *** Radiation: *** Patient believes might be causing their pain: *** Improvement with Nitroglycerine: *** Pt does *** take ASA daily.  Treatments tried ***  Follows with *** cardiology.    {Associated Symptoms:25148}  {Patient Denies:25149}  Pt reports *** history of PE, DVT, DM, HTN, cancer, recent surgery ***  Tobacco use ***    *** Fever : no  Chills: no Sore throat: no   Cough: no Sputum: no Nasal congestion : no  Appetite: normal  Hydration: normal  Abdominal pain: no Nausea: no Vomiting: no Dysuria: no  Sleep disturbance: no Back Pain: no Headache: no   Past Medical History:  Diagnosis Date   GERD (gastroesophageal reflux disease)    No pertinent past medical history     Patient Active Problem List   Diagnosis Date Noted   Supervision of low-risk pregnancy 09/24/2013   Candidal skin infection 07/05/2011   Normal vaginal delivery 05/13/2011   FINGER SPRAIN 07/24/2009   DENTAL PAIN 03/04/2009   LOW BACK PAIN, CHRONIC 02/12/2009    Past Surgical History:  Procedure Laterality Date   NO PAST SURGERIES     TUBAL LIGATION      OB History     Gravida  4   Para  3   Term  3   Preterm  0   AB  0   Living  3      SAB  0   IAB  0   Ectopic  0   Multiple  0   Live Births  2            Home Medications    Prior to Admission medications   Medication Sig Start Date End Date Taking? Authorizing Provider  clonazePAM (KLONOPIN) 0.5 MG tablet Take by mouth. 12/18/22  Yes [provider]  hydrOXYzine (ATARAX) 50 MG tablet  Take by mouth. 02/09/21  Yes [provider]  pantoprazole (PROTONIX) 40 MG tablet Take by mouth. 01/27/21  Yes [provider]  sertraline (ZOLOFT) 100 MG tablet Take by mouth. 07/07/22  Yes [provider]  ipratropium (ATROVENT) 0.06 % nasal spray Place 2 sprays into both nostrils 4 (four) times daily. 03/28/23   Shirlee Latch, PA-C  predniSONE (DELTASONE) 20 MG tablet Take 2 tablets (40 mg total) by mouth daily with breakfast. 09/24/20   Bing Neighbors, NP  prenatal vitamin w/FE, FA (NATACHEW) 29-1 MG CHEW chewable tablet Chew 1 tablet by mouth daily at 12 noon.    [provider]  promethazine-dextromethorphan (PROMETHAZINE-DM) 6.25-15 MG/5ML syrup Take 5 mLs by mouth 4 (four) times daily as needed. 03/28/23   Eusebio Friendly B, PA-C  tiZANidine (ZANAFLEX) 4 MG tablet Take 1 tablet (4 mg total) by mouth every 6 (six) hours as needed for muscle spasms. 09/24/20   Bing Neighbors, NP    Family History Family History  Problem Relation Age of Onset   Heart disease Mother    Atrial fibrillation Mother    Heart attack Mother    Diabetes  Paternal Uncle     Social History Social History   Tobacco Use   Smoking status: Former    Packs/day: 1.00    Years: 6.00    Additional pack years: 0.00    Total pack years: 6.00    Types: Cigarettes    Quit date: 09/03/2020    Years since quitting: 2.6   Smokeless tobacco: Never  Substance Use Topics   Alcohol use: No   Drug use: No     Allergies   Tramadol hcl, Tramadol hcl, and Phentermine   Review of Systems Review of Systems :negative unless otherwise stated in HPI.      Physical Exam Triage Vital Signs ED Triage Vitals  Enc Vitals Group     BP 04/23/23 1310 102/77     Pulse Rate 04/23/23 1310 91     Resp --      Temp 04/23/23 1310 98.2 F (36.8 C)     Temp Source 04/23/23 1310 Oral     SpO2 04/23/23 1310 98 %     Weight 04/23/23 1300 185 lb (83.9 kg)     Height 04/23/23 1300 5\' 5"   (1.651 m)     Head Circumference --      Peak Flow --      Pain Score 04/23/23 1309 5     Pain Loc --      Pain Edu? --      Excl. in GC? --    No data found.  Updated Vital Signs BP 102/77 (BP Location: Left Arm)   Pulse 91   Temp 98.2 F (36.8 C) (Oral)   Ht 5\' 5"  (1.651 m)   Wt 83.9 kg   LMP 04/08/2023 (Approximate)   SpO2 98%   Breastfeeding No   BMI 30.79 kg/m   Visual Acuity Right Eye Distance:   Left Eye Distance:   Bilateral Distance:    Right Eye Near:   Left Eye Near:    Bilateral Near:     Physical Exam  GEN: pleasant well appearing female, in no acute distress *** CV: regular rate and rhythm,  no murmurs, rubs or gallops  appreciated,, no JVP *** CHEST WALL: *** RESP: no increased work of breathing, clear to ascultation bilaterally ABD: Bowel sounds present. Soft, non-tender, non-distended. *** MSK: no edema, or calf tenderness SKIN: warm, dry, no rash on visible skin NEURO: alert, moves all extremities appropriately PSYCH: Normal affect, appropriate speech and behavior   UC Treatments / Results  Labs (all labs ordered are listed, but only abnormal results are displayed) Labs Reviewed  BASIC METABOLIC PANEL - Abnormal; Notable for the following components:      Result Value   Calcium 8.8 (*)    All other components within normal limits  CBC WITH DIFFERENTIAL/PLATELET - Abnormal; Notable for the following components:   WBC 10.6 (*)    All other components within normal limits  LIPASE, BLOOD  TROPONIN I (HIGH SENSITIVITY)    EKG  See my EKG interpretation in the MDM section  Radiology No results found.   Procedures Procedures (including critical care time)  Medications Ordered in UC Medications  alum & mag hydroxide-simeth (MAALOX/MYLANTA) 200-200-20 MG/5ML suspension 30 mL (30 mLs Oral Given 04/23/23 1411)  lidocaine (XYLOCAINE) 2 % viscous mouth solution 15 mL (15 mLs Mouth/Throat Given 04/23/23 1412)    Initial Impression /  Assessment and Plan / UC Course  I have reviewed the triage vital signs and the nursing notes.  Pertinent labs &  imaging results that were available during my care of the patient were reviewed by me and considered in my medical decision making (see chart for details).       Patient is a 37 y.o. female with history of ***  who presents with *** day of ***sided chest pain.  Differential diagnosis includes, but is not limited to, ACS, aortic dissection, pulmonary embolism, cardiac tamponade, pneumothorax, pneumonia, pericarditis/myocarditis, GI-related causes including esophagitis/gastritis, and musculoskeletal chest wall pain   Kelissa has ***no history of PE.  Able to [facility] pt out therefore doubt PE.  EKG obtained and is without ST elevations or concern for ischemia, ***NSR.  CBC unremarkable for anemia.  There were no electrolyte abnormalitis seen on CMP.  Chest x-ray did not show bacterial pneumonia, pleural effusions or pneumothorax. Initial hsTrop was negative.  I do not think his chest pain is cardiopulmonary related. No GERD symptoms. Not likely illicit drug induced. No other anxiety signs or symptoms. Exam not concerning for costochondritis however pain is reproducible, on exam.  This does not appear to be MSK related.     CBC and BMP are unremarkable. Lipase is normal.   She does *** use marijuana and this could be drug-related.  -***-Initial cardiac HS troponin negative. HEART pathway score is ***, indicating that the patient has an elevated risk score and requires further evaluation.  It may anxiety. Pt was given*** Atarax 25 mg 3 times daily as needed and was advised to follow with his primary care provider, if this medication helps their chest discomfort.  Discussed MDM, treatment plan and plan for follow-up with patient/parent who agrees with plan.    *** PE: History of PE ***. Unable to [facility] her out.  Wells score ***.  Obtained STAT D-dimer.  Satting well on room air. Advised to go to  the ED for CTA Chest, if elevated.  Anxiety: *** ACS: EKG obtained without ST elevations or concern for ischemia at this time. MSK/Costochondritis: pain ***not reproducible on exam.  Anemia: ***History of fibroids/abnormal uterine bleeding.  Obtained CBC Electrolyte abnormalities: obtain CMP Thyroid dysregulation: Consider TSH with reflex T4.       Final Clinical Impressions(s) / UC Diagnoses   Final diagnoses:  None   Discharge Instructions   None    ED Prescriptions   None    PDMP not reviewed this encounter.

## 2023-08-31 ENCOUNTER — Ambulatory Visit (INDEPENDENT_AMBULATORY_CARE_PROVIDER_SITE_OTHER): Payer: BLUE CROSS/BLUE SHIELD

## 2023-08-31 ENCOUNTER — Encounter: Payer: Self-pay | Admitting: Emergency Medicine

## 2023-08-31 ENCOUNTER — Ambulatory Visit
Admission: EM | Admit: 2023-08-31 | Discharge: 2023-08-31 | Disposition: A | Discharge status: Home / Self Care | Payer: BLUE CROSS/BLUE SHIELD | Attending: Emergency Medicine | Admitting: Emergency Medicine

## 2023-08-31 DIAGNOSIS — S60211A Contusion of right wrist, initial encounter: Secondary | ICD-10-CM

## 2023-08-31 DIAGNOSIS — S5011XA Contusion of right forearm, initial encounter: Secondary | ICD-10-CM

## 2023-08-31 NOTE — ED Provider Notes (Signed)
[facility]    CSN: [id number] Arrival date & time: 08/31/23  1317      History   Chief Complaint Chief Complaint  Patient presents with   Wrist Pain    right    HPI Karen Khan is a 37 y.o. female.   HPI  64 old female with a past medical history significant for GERD and chronic low back pain presents for evaluation of pain in her right wrist and forearm.  She reports that 2 nights ago she was attempting to untangle her 19-year-old ball when the ball spoke and it caused her to fall landing on her right forearm.  She has bruising and swelling to the right forearm and limited range of motion of her wrist.  She is also complaining of numbness and tingling in her fingers.  Past Medical History:  Diagnosis Date   GERD (gastroesophageal reflux disease)    No pertinent past medical history     Patient Active Problem List   Diagnosis Date Noted   Supervision of low-risk pregnancy 09/24/2013   Candidal skin infection 07/05/2011   Normal vaginal delivery 05/13/2011   Sprain of hand 07/24/2009   Disorder of teeth and supporting structures 03/04/2009   LOW BACK PAIN, CHRONIC 02/12/2009    Past Surgical History:  Procedure Laterality Date   NO PAST SURGERIES     TUBAL LIGATION      OB History     Gravida  4   Para  3   Term  3   Preterm  0   AB  0   Living  3      SAB  0   IAB  0   Ectopic  0   Multiple  0   Live Births  2            Home Medications    Prior to Admission medications   Medication Sig Start Date End Date Taking? Authorizing Provider  clonazePAM (KLONOPIN) 0.5 MG tablet Take by mouth. 12/18/22  Yes [provider]  hydrOXYzine (ATARAX) 50 MG tablet Take by mouth. 02/09/21  Yes [provider]  pantoprazole (PROTONIX) 40 MG tablet Take by mouth. 01/27/21  Yes [provider]  sertraline (ZOLOFT) 100 MG tablet Take by mouth. 07/07/22  Yes [provider]  famotidine (PEPCID) 20 MG tablet  Take 1 tablet (20 mg total) by mouth 2 (two) times daily. 04/23/23   Katha Cabal, DO  prenatal vitamin w/FE, FA (NATACHEW) 29-1 MG CHEW chewable tablet Chew 1 tablet by mouth daily at 12 noon.    [provider]  tiZANidine (ZANAFLEX) 4 MG tablet Take 1 tablet (4 mg total) by mouth every 6 (six) hours as needed for muscle spasms. 09/24/20   Bing Neighbors, NP    Family History Family History  Problem Relation Age of Onset   Heart disease Mother    Atrial fibrillation Mother    Heart attack Mother    Diabetes Paternal Uncle     Social History Social History   Tobacco Use   Smoking status: Former    Current packs/day: 0.00    Average packs/day: 1 pack/day for 6.0 years (6.0 ttl pk-yrs)    Types: Cigarettes    Start date: 09/03/2014    Quit date: 09/03/2020    Years since quitting: 2.9   Smokeless tobacco: Never  Vaping Use   Vaping status: Every Day  Substance Use Topics   Alcohol use: No   Drug use:  No     Allergies   Tramadol hcl, Tramadol hcl, and Phentermine   Review of Systems Review of Systems  Musculoskeletal:  Positive for arthralgias and joint swelling.  Skin:  Positive for color change.  Neurological:  Positive for numbness. Negative for weakness.     Physical Exam Triage Vital Signs ED Triage Vitals  Encounter Vitals Group     BP 08/31/23 1331 121/74     Systolic BP Percentile --      Diastolic BP Percentile --      Pulse Rate 08/31/23 1331 84     Resp 08/31/23 1331 14     Temp 08/31/23 1331 98.8 F (37.1 C)     Temp Source 08/31/23 1331 Oral     SpO2 08/31/23 1331 98 %     Weight 08/31/23 1329 180 lb (81.6 kg)     Height 08/31/23 1329 5\' 5"  (1.651 m)     Head Circumference --      Peak Flow --      Pain Score 08/31/23 1329 8     Pain Loc --      Pain Education --      Exclude from Growth Chart --    No data found.  Updated Vital Signs BP 121/74 (BP Location: Left Arm)   Pulse 84   Temp 98.8 F (37.1 C) (Oral)   Resp 14    Ht 5\' 5"  (1.651 m)   Wt 180 lb (81.6 kg)   LMP 08/31/2023 (Exact Date)   SpO2 98%   BMI 29.95 kg/m   Visual Acuity Right Eye Distance:   Left Eye Distance:   Bilateral Distance:    Right Eye Near:   Left Eye Near:    Bilateral Near:     Physical Exam Vitals and nursing note reviewed.  Constitutional:      Appearance: Normal appearance. She is not ill-appearing.  Musculoskeletal:        General: Swelling, tenderness and signs of injury present. No deformity.  Skin:    General: Skin is warm and dry.     Capillary Refill: Capillary refill takes less than 2 seconds.     Findings: Bruising present.  Neurological:     General: No focal deficit present.     Mental Status: She is alert and oriented to person, place, and time.      UC Treatments / Results  Labs (all labs ordered are listed, but only abnormal results are displayed) Labs Reviewed - No data to display  EKG   Radiology No results found.  Procedures Procedures (including critical care time)  Medications Ordered in UC Medications - No data to display  Initial Impression / Assessment and Plan / UC Course  I have reviewed the triage vital signs and the nursing notes.  Pertinent labs & imaging results that were available during my care of the patient were reviewed by me and considered in my medical decision making (see chart for details).   Patient is a pleasant, nontoxic-appearing 37 year old female presenting for evaluation of pain and swelling to the right wrist and forearm as outlined HPI above.  On exam her right wrist and forearm are normal anatomical alignment though there is visible edema along with ecchymosis along the ulnar aspect of the forearm.  She has limited range of motion of her wrist secondary to pain but her grip strength is 5/5 in her right hand.  She has no pain with palpation of her phalanges or metacarpals.  There  is pain with palpation of the carpals as well as with compression of the  radial and ulnar styloid.  She also has pain with palpation of the distal third of the ulna.  I will obtain radiographs of right wrist and forearm to evaluate for any bony abnormality.  Right wrist films independently reviewed and evaluated by me.  Impression: No evidence of fracture or dislocation.  There is mild soft tissue swelling visible on the medial aspect.  Radiology overread is pending.  Right radius and ulna films independently reviewed and evaluated by me.  Impression: No evidence of fracture or dislocation.  Radiology overread is pending.  I will discharge patient home with a diagnosis of contusion of her right wrist and forearm that have staff place her in a Velcro wrist splint to keep her wrist in a neutral position.  She can use over-the-counter Tylenol and/or ibuprofen according the package instructions as needed for pain.  I also recommend ice application for 20 minutes at a time 2-3 times a day.  She should take the wrist splint off and put her wrist through range of motion exercises once or twice daily to help maintain mobility.  If her symptoms do not improve, or they worsen, I recommend she follow-up with orthopedics.   Final Clinical Impressions(s) / UC Diagnoses   Final diagnoses:  Contusion of right wrist, initial encounter  Contusion of right forearm, initial encounter     Discharge Instructions      Your x-rays did not demonstrate any broken bones.  You do have soft tissue swelling visible on your x-rays which indicates that you have bruised the underlying soft tissue.  Wear the wrist splint that we have provided for you at all times except when bathing or washing your hands.  This will keep your wrist in a neutral position and help prevent further injury.  You should take the splint off once or twice a day to do range of motion exercises.  You been given a handout in your discharge paperwork.  You may also take the wrist been off to apply ice to your wrist for 20  minutes at a time 2-3 times a day.  Make sure you have a cloth between the ice and your skin to prevent skin damage.  You may use over-the-counter Tylenol and/or ibuprofen according the package instructions as needed for pain and inflammation.  If your symptoms do not improve, or they worsen, I recommend that you follow-up with orthopedics such as [facility] here in [place] or [place].     ED Prescriptions   None    PDMP not reviewed this encounter.   Becky Augusta, NP 08/31/23 [phone]

## 2023-08-31 NOTE — ED Triage Notes (Signed)
Patient states that on Wed she was untangling her 36 year old bull and the bull got spooked and she fell injury her right wrist and forearm.

## 2023-08-31 NOTE — Discharge Instructions (Addendum)
Your x-rays did not demonstrate any broken bones.  You do have soft tissue swelling visible on your x-rays which indicates that you have bruised the underlying soft tissue.  Wear the wrist splint that we have provided for you at all times except when bathing or washing your hands.  This will keep your wrist in a neutral position and help prevent further injury.  You should take the splint off once or twice a day to do range of motion exercises.  You been given a handout in your discharge paperwork.  You may also take the wrist been off to apply ice to your wrist for 20 minutes at a time 2-3 times a day.  Make sure you have a cloth between the ice and your skin to prevent skin damage.  You may use over-the-counter Tylenol and/or ibuprofen according the package instructions as needed for pain and inflammation.  If your symptoms do not improve, or they worsen, I recommend that you follow-up with orthopedics such as [facility] here in [place] or [place].
